# Patient Record
Sex: Female | Born: 1960 | Race: Black or African American | Hispanic: No | Marital: Single
Health system: Southern US, Community
[De-identification: ages and names within clinical notes are randomized; demographics above are authoritative.]

## PROBLEM LIST (undated history)

## (undated) DIAGNOSIS — G8929 Other chronic pain: Secondary | ICD-10-CM

## (undated) DIAGNOSIS — I10 Essential (primary) hypertension: Secondary | ICD-10-CM

## (undated) DIAGNOSIS — J45909 Unspecified asthma, uncomplicated: Secondary | ICD-10-CM

## (undated) DIAGNOSIS — Z9109 Other allergy status, other than to drugs and biological substances: Secondary | ICD-10-CM

## (undated) DIAGNOSIS — M5430 Sciatica, unspecified side: Secondary | ICD-10-CM

## (undated) DIAGNOSIS — M549 Dorsalgia, unspecified: Secondary | ICD-10-CM

## (undated) DIAGNOSIS — M47812 Spondylosis without myelopathy or radiculopathy, cervical region: Secondary | ICD-10-CM

## (undated) DIAGNOSIS — J449 Chronic obstructive pulmonary disease, unspecified: Secondary | ICD-10-CM

## (undated) DIAGNOSIS — F419 Anxiety disorder, unspecified: Secondary | ICD-10-CM

## (undated) HISTORY — PX: CERVICAL DISC SURGERY: SHX588

## (undated) HISTORY — DX: Dorsalgia, unspecified: M54.9

## (undated) HISTORY — PX: COLON RESECTION: SHX5231

## (undated) HISTORY — PX: OTHER SURGICAL HISTORY: SHX169

## (undated) HISTORY — DX: Sciatica, unspecified side: M54.30

## (undated) HISTORY — DX: Essential (primary) hypertension: I10

## (undated) HISTORY — DX: Other chronic pain: G89.29

## (undated) HISTORY — PX: LAPAROSCOPY: SHX197

## (undated) HISTORY — DX: Spondylosis without myelopathy or radiculopathy, cervical region: M47.812

## (undated) HISTORY — PX: TOTAL ABDOMINAL HYSTERECTOMY: SHX209

## (undated) HISTORY — PX: KNEE ARTHROSCOPY: SHX127

## (undated) HISTORY — DX: Anxiety disorder, unspecified: F41.9

---

## 2009-09-01 ENCOUNTER — Ambulatory Visit: Payer: Self-pay | Admitting: Family Medicine

## 2010-01-07 ENCOUNTER — Ambulatory Visit: Payer: Self-pay

## 2010-02-24 ENCOUNTER — Emergency Department: Payer: Self-pay | Admitting: Emergency Medicine

## 2010-06-15 ENCOUNTER — Emergency Department: Payer: Self-pay | Admitting: Emergency Medicine

## 2011-01-15 IMAGING — CT CT CERVICAL SPINE WITHOUT CONTRAST
3 series · 16 of 33 positions shown, 19 images · non-contrast
Comparison: None

REASON FOR EXAM: pain after injury
COMMENTS:

PROCEDURE:     CT  - CT CERVICAL SPINE WO  - June 16, 2010  [DATE]
RESULT:     Clinical Indication: Trauma
TECHNIQUE: Multiple axial CT images from the skull base to the mid vertebral
body of T1. obtained with sagittal and coronal reformatted images provided.

[Series 3: axial · axial · 0.33mm/px · z∈[+797,+926]mm · 8 of 81 slices shown, 10 images]
[im 7/81  soft-tissue]
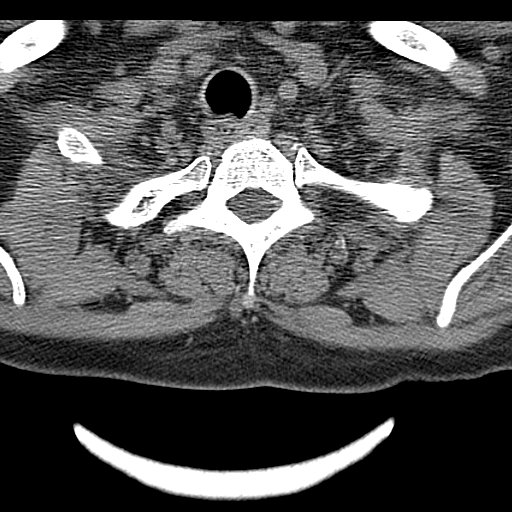
[im 7/81  bone]
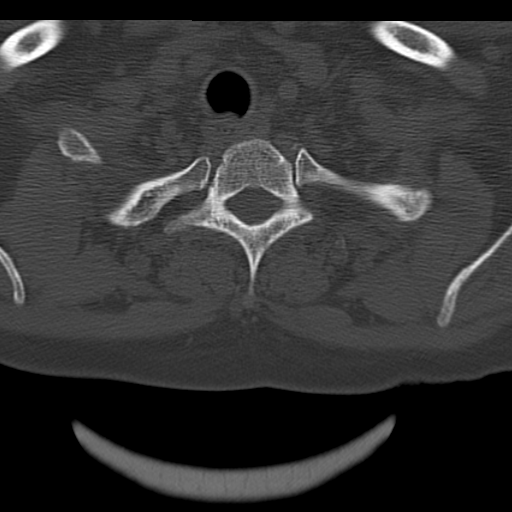
[im 19/81  bone]
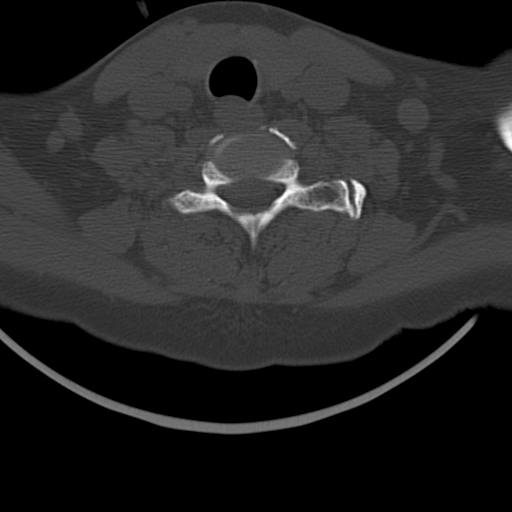
[im 25/81  bone]
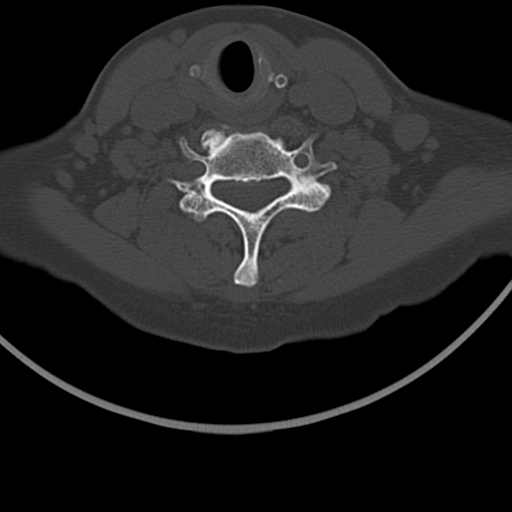
[im 37/81  bone]
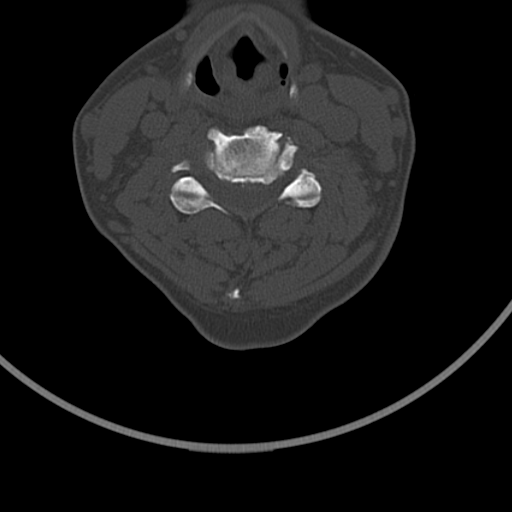
[im 44/81  soft-tissue]
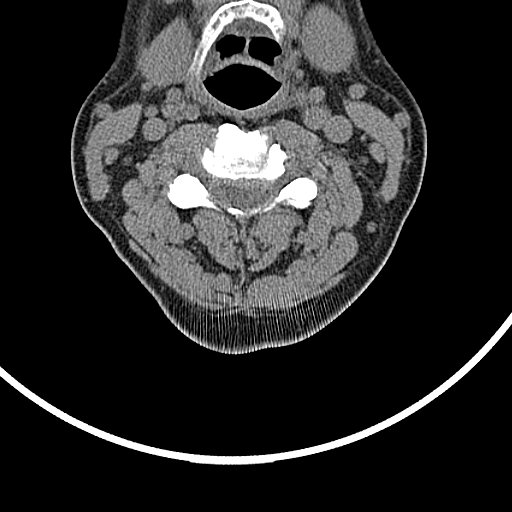
[im 44/81  bone]
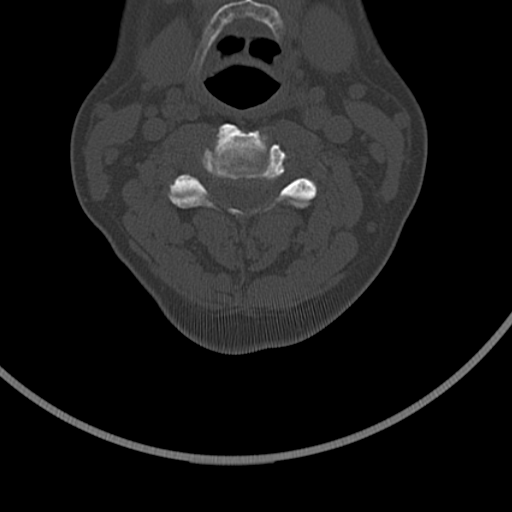
[im 56/81  bone]
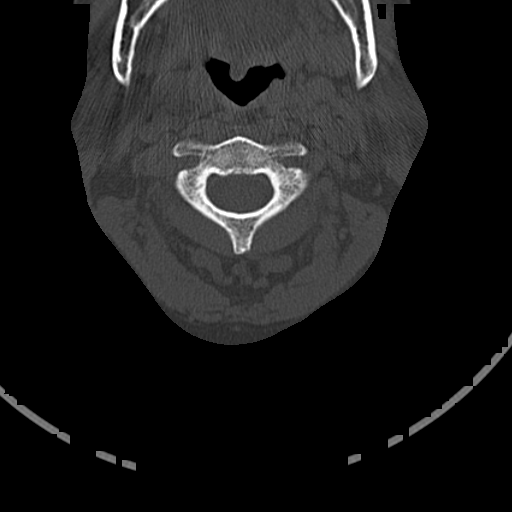
[im 62/81  bone]
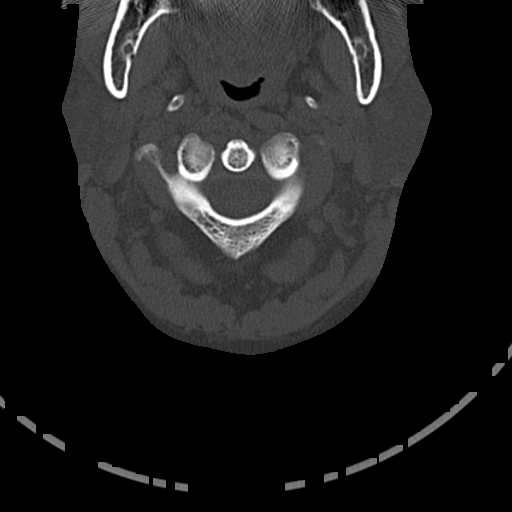
[im 74/81  bone]
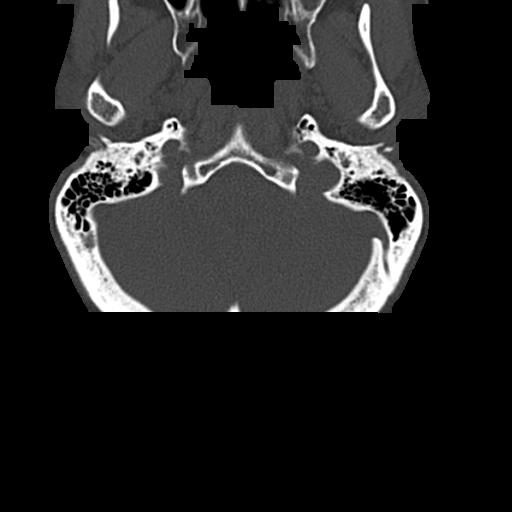

[Series 4: sagittal · sagittal · 0.37mm/px · 5 of 42 slices shown, 6 images]
[im 14/42  bone]
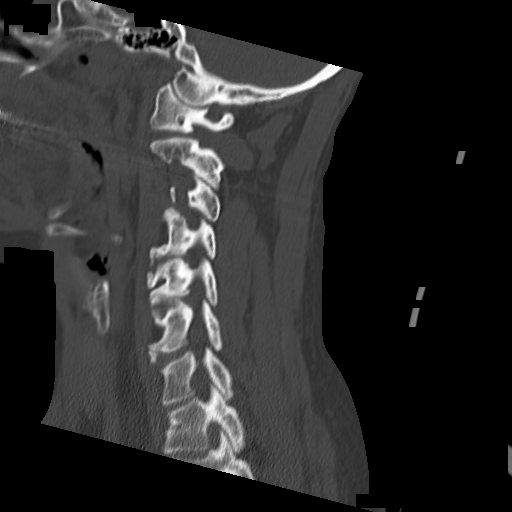
[im 18/42  bone]
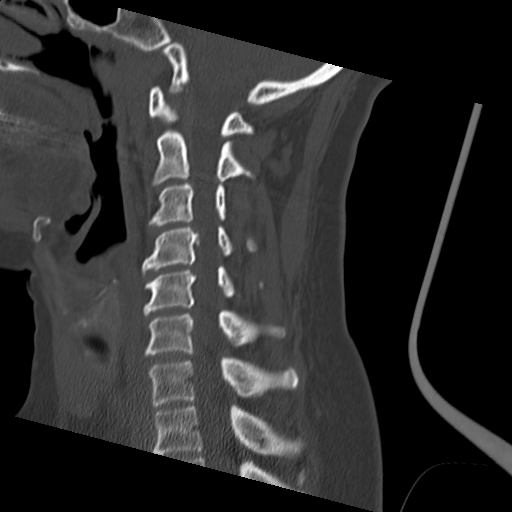
[im 21/42  soft-tissue]
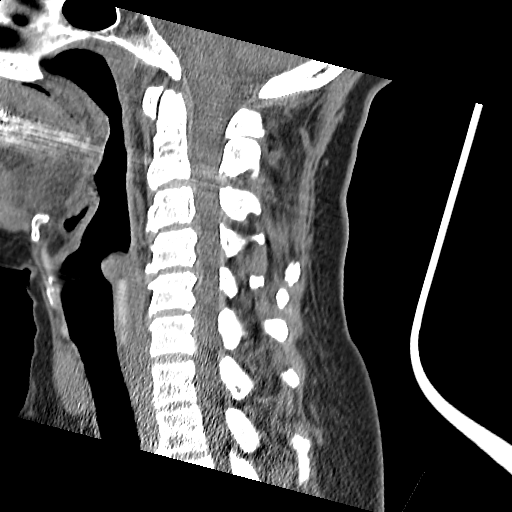
[im 21/42  bone]
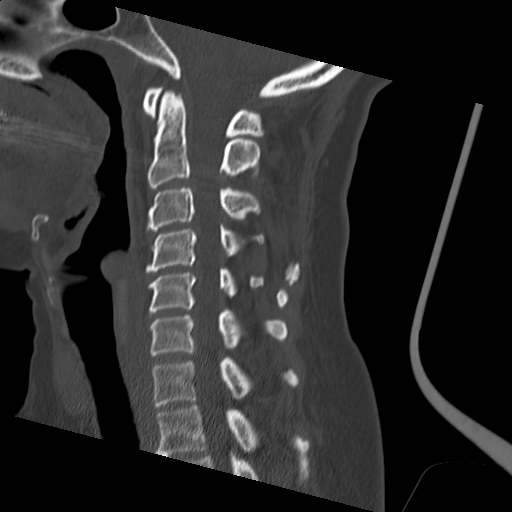
[im 24/42  bone]
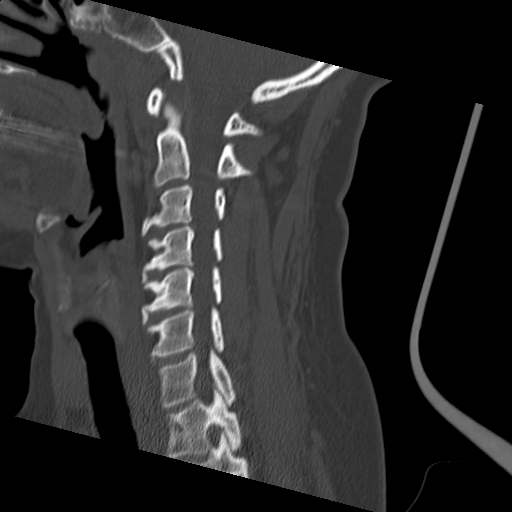
[im 28/42  bone]
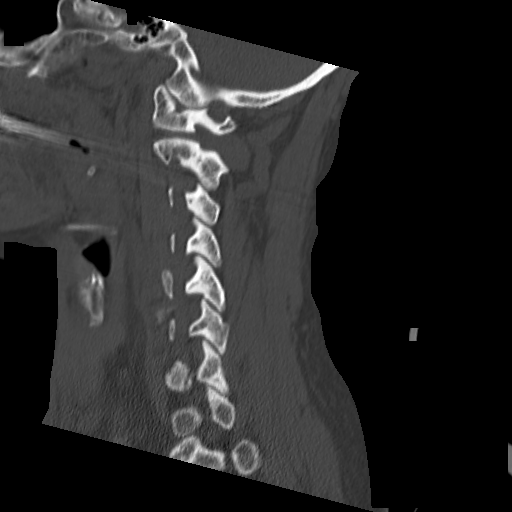

[Series 5: coronal · coronal · 0.37mm/px · 3 of 34 slices shown]
[im 7/34  bone]
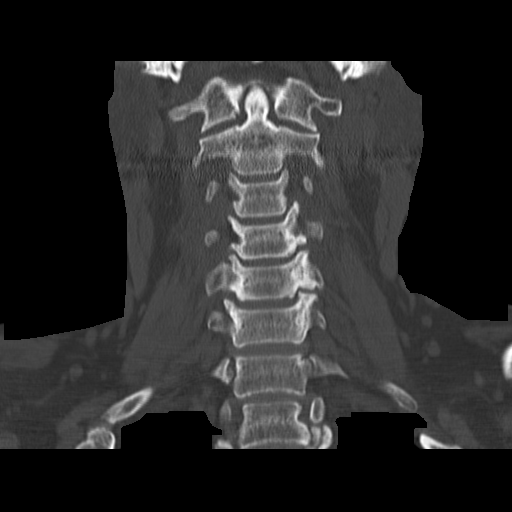
[im 14/34  bone]
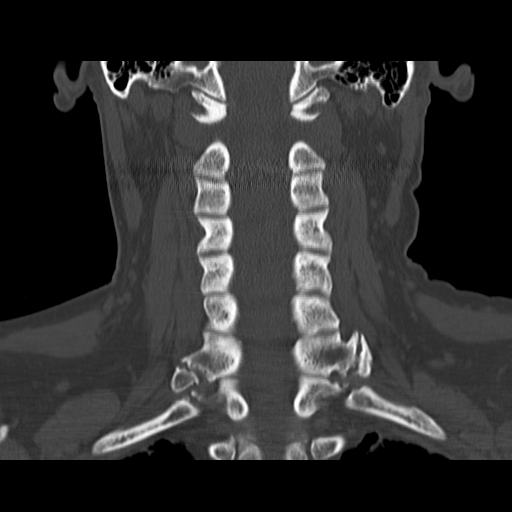
[im 20/34  bone]
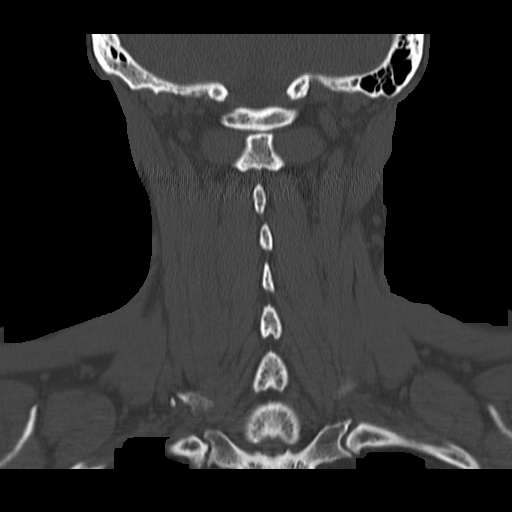

[16 of 33 positions shown; findings below may reference images not displayed]

FINDINGS: The alignment is anatomic. The vertebral body heights are maintained. There
is loss the normal cervical lordosis with straightening. There is no acute
fracture or static listhesis. The prevertebral soft tissues are normal. The
intraspinal soft tissues are not fully imaged on this examination due to
poor soft tissue contrast, but there is no soft tissue gross abnormality.

There is mild degenerative disc disease throughout the cervical spine most
significant at C3-C4, C4-C5 and C5-C6. There are mild broad-based disc
bulges at C3-C4 and C4-C5.

There are bullous changes at the lung apices.
IMPRESSION: 1. No acute osseous injury of the cervical spine.

2. Ligamentous injury is not evaluated. If there is high clinical concern
for ligamentous injury, consider MRI or flexion/extension radiographs as
clinically indicated and tolerated.

## 2011-02-07 ENCOUNTER — Encounter (HOSPITAL_COMMUNITY)
Admission: RE | Admit: 2011-02-07 | Discharge: 2011-02-07 | Disposition: A | Discharge status: Home / Self Care | Payer: Worker's Compensation | Source: Ambulatory Visit | Attending: Neurosurgery | Admitting: Neurosurgery

## 2011-02-07 ENCOUNTER — Ambulatory Visit (HOSPITAL_COMMUNITY)
Admission: RE | Admit: 2011-02-07 | Discharge: 2011-02-07 | Disposition: A | Discharge status: Home / Self Care | Payer: Worker's Compensation | Source: Ambulatory Visit | Attending: Neurosurgery | Admitting: Neurosurgery

## 2011-02-07 ENCOUNTER — Other Ambulatory Visit (HOSPITAL_COMMUNITY): Payer: Self-pay | Admitting: Neurosurgery

## 2011-02-07 DIAGNOSIS — M47812 Spondylosis without myelopathy or radiculopathy, cervical region: Secondary | ICD-10-CM | POA: Insufficient documentation

## 2011-02-07 DIAGNOSIS — Z87891 Personal history of nicotine dependence: Secondary | ICD-10-CM | POA: Insufficient documentation

## 2011-02-07 DIAGNOSIS — M5020 Other cervical disc displacement, unspecified cervical region: Secondary | ICD-10-CM

## 2011-02-07 DIAGNOSIS — Z01818 Encounter for other preprocedural examination: Secondary | ICD-10-CM | POA: Insufficient documentation

## 2011-02-07 DIAGNOSIS — J449 Chronic obstructive pulmonary disease, unspecified: Secondary | ICD-10-CM | POA: Insufficient documentation

## 2011-02-07 DIAGNOSIS — J4489 Other specified chronic obstructive pulmonary disease: Secondary | ICD-10-CM | POA: Insufficient documentation

## 2011-02-07 DIAGNOSIS — R7611 Nonspecific reaction to tuberculin skin test without active tuberculosis: Secondary | ICD-10-CM | POA: Insufficient documentation

## 2011-02-07 DIAGNOSIS — R05 Cough: Secondary | ICD-10-CM | POA: Insufficient documentation

## 2011-02-07 DIAGNOSIS — R059 Cough, unspecified: Secondary | ICD-10-CM | POA: Insufficient documentation

## 2011-02-07 LAB — CBC
HCT: 40.9 % (ref 36.0–46.0)
Hemoglobin: 13.9 g/dL (ref 12.0–15.0)
RBC: 4.85 MIL/uL (ref 3.87–5.11)
WBC: 12.7 10*3/uL — ABNORMAL HIGH (ref 4.0–10.5)

## 2011-02-07 LAB — BASIC METABOLIC PANEL
CO2: 26 mEq/L (ref 19–32)
Chloride: 110 mEq/L (ref 96–112)
GFR calc Af Amer: >60 mL/min (ref >60)
Glucose, Bld: 97 mg/dL (ref 70–99)
Potassium: 4.9 mEq/L (ref 3.5–5.1)
Sodium: 144 mEq/L (ref 135–145)

## 2011-02-07 LAB — SURGICAL PCR SCREEN: MRSA, PCR: NEGATIVE

## 2011-02-10 ENCOUNTER — Inpatient Hospital Stay (HOSPITAL_COMMUNITY)
Admission: RE | Admit: 2011-02-10 | Discharge: 2011-02-13 | DRG: 473 | Disposition: A | Discharge status: Home / Self Care | Payer: Worker's Compensation | Source: Ambulatory Visit | Attending: Neurosurgery | Admitting: Neurosurgery

## 2011-02-10 ENCOUNTER — Inpatient Hospital Stay (HOSPITAL_COMMUNITY): Payer: Worker's Compensation

## 2011-02-10 DIAGNOSIS — M4802 Spinal stenosis, cervical region: Secondary | ICD-10-CM | POA: Diagnosis present

## 2011-02-10 DIAGNOSIS — I10 Essential (primary) hypertension: Secondary | ICD-10-CM | POA: Diagnosis present

## 2011-02-10 DIAGNOSIS — M4712 Other spondylosis with myelopathy, cervical region: Principal | ICD-10-CM | POA: Diagnosis present

## 2011-02-11 NOTE — Op Note (Signed)
NAMELADELL, BEY                  ACCOUNT NO.:  [id number]  MEDICAL RECORD NO.:  [id number]           PATIENT TYPE:  I  LOCATION:  3037                         FACILITY:  [facility]  PHYSICIAN:  Danae Orleans. Venetia Maxon, M.D.  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  PROCEDURE:  Anterior cervical decompression and fusion C3-4, C4-5 and C5- 6 levels with PEEK interbody cages, morselized bone autograft, allograft anterior cervical plate.  SURGEON:  Danae Orleans. Venetia Maxon, MD  ASSISTANT:  Georgiann Cocker, RN and Hilda Lias, MD  ANESTHESIA:  General endotracheal anesthesia.  ESTIMATED BLOOD LOSS:  Minimal.  COMPLICATIONS:  None.  DISPOSITION:  Recovery.  INDICATIONS:  Victoria Mata is a 50 year old woman with a cervical spondylitic myelopathy with cord compression.  She has marked spinal stenosis at C3-4 and C4-5 levels and to a less degree at C5-6 level.  It was elected to take her to surgery for anterior cervical decompression and fusion at C3-4, C4-5 and C5-6 levels.  PROCEDURE:  Ms. Schnelle was brought to the operating room.  Following satisfactory and uncomplicated induction of general endotracheal anesthesia which was done with maintaining her neck in neutral alignment, she was placed in 5 pounds halter traction.  Her anterior neck was then prepped and draped in the usual sterile fashion.  The area of planned incision was infiltrated with local lidocaine.  Incision was made from the midline to the anterior border of the sternocleidomastoid muscle on the left side of midline, carried through platysma layer. Subplatysmal  dissection was performed exposing the anterior border of the sternocleidomastoid muscle, keeping the carotid sheath lateral and trachea and esophagus kept medial exposed.  The anterior cervical spine was exposed and a bent spinal needle was placed over felt to be the C3-4 and C4-5 level.  This was confirmed on intraoperative x-ray.  She had significant spondylitic degenerative changes at C3-4, C4-5 and C5-6 levels and ventral osteophytes were removed with Leksell rongeurs and osteophyte tool.  The longus colli muscles were taken down from the anterior cervical spine with electrocautery and Key elevator, C3 through C6 levels.  Initially, decompression was performed at C3-4 and C4-5 levels, but these were the levels with most severe spinal cord compression. Interspace were incised and osteophytes were removed and disk material was removed.  The distraction pins were placed at C3-C4 and using the interspace opened, endplates were eburnated with high- speed drill and uncinate spurs were drilled down.  The spinal cord dura was decompressed and large herniated disk was removed with significant decompression of the spinal cord dura.  Hemostasis assured with Gelfoam soaked in thrombin and then attention was turned to the C4-5 level where similar decompression was performed and a large herniated disk was also removed with significant decompression of the spinal cord and dura. After trial  sizing 5-mm medium PEEK interbody cages were selected, they were packed with PureGen a soaked profuse blocks which were removed along with bone autograft.  Attention was then turned to the C5-6 level, where similar decompression was performed.  Spinal cord dura and C6 nerve roots were widely decompressed.  After trial sizing, a 6-mm cage was packed in a similar fashion, inserted in the interspace and countersunk appropriately.  Traction weight was removed.  The distraction pin hole sites were packed with Gelfoam.   A 48-mm Trestle anterior cervical plate was then affixed to the anterior cervical spine using variable angle 12-mm screws, 2 at C3, 2 at C4, 2 at C5, 2 at C6. All screws had excellent purchase.  Locking mechanisms were engaged. Final x-ray demonstrated well-positioned interbody graft in the anterior cervical plate.  The wound was irrigated.  Soft tissues were inspected and found to be in good repair.  Hemostasis was assured.  A #10 JP drain was inserted through separate stab incision and anchored with a Vicryl stitch.  The platysma layer was closed with 3-0 Vicryl sutures.  Skin edges were reapproximated with 3-0 Vicryl subcuticular stitch.  The wound was dressed with Dermabond.  The patient was extubated in the operating room, taken to recovery room in stable satisfactory condition having tolerated the operation well.  Counts were correct at the end of the case.     Danae Orleans. Venetia Maxon, M.D.     JDS/MEDQ  D:  02/10/2011  T:  02/11/2011  Job:  [id number]  Electronically Signed by Maeola Harman M.D. on 02/11/2011 10:01:55 AM

## 2011-02-20 ENCOUNTER — Emergency Department (HOSPITAL_COMMUNITY): Payer: Worker's Compensation

## 2011-02-20 ENCOUNTER — Inpatient Hospital Stay (HOSPITAL_COMMUNITY)
Admission: EM | Admit: 2011-02-20 | Discharge: 2011-02-26 | DRG: 093 | Disposition: A | Discharge status: Home Health | Payer: Worker's Compensation | Attending: Neurosurgery | Admitting: Neurosurgery

## 2011-02-20 DIAGNOSIS — Y831 Surgical operation with implant of artificial internal device as the cause of abnormal reaction of the patient, or of later complication, without mention of misadventure at the time of the procedure: Secondary | ICD-10-CM | POA: Diagnosis present

## 2011-02-20 DIAGNOSIS — T85738A Infection and inflammatory reaction due to other nervous system device, implant or graft, initial encounter: Principal | ICD-10-CM | POA: Diagnosis present

## 2011-02-20 LAB — DIFFERENTIAL
Basophils Relative: 0 % (ref 0–1)
Eosinophils Absolute: 0.2 10*3/uL (ref 0.0–0.7)
Monocytes Absolute: 1.5 10*3/uL — ABNORMAL HIGH (ref 0.1–1.0)
Neutrophils Relative %: 63 % (ref 43–77)

## 2011-02-20 LAB — URINALYSIS, ROUTINE W REFLEX MICROSCOPIC
Nitrite: NEGATIVE
Specific Gravity, Urine: 1.034 — ABNORMAL HIGH (ref 1.005–1.030)
pH: 6.5 (ref 5.0–8.0)

## 2011-02-20 LAB — CBC
MCH: 28.4 pg (ref 26.0–34.0)
MCHC: 34 g/dL (ref 30.0–36.0)
Platelets: 584 10*3/uL — ABNORMAL HIGH (ref 150–400)
RBC: 4.29 MIL/uL (ref 3.87–5.11)

## 2011-02-20 LAB — COMPREHENSIVE METABOLIC PANEL
CO2: 26 mEq/L (ref 19–32)
Calcium: 10.3 mg/dL (ref 8.4–10.5)
Creatinine, Ser: 0.79 mg/dL (ref 0.4–1.2)
GFR calc Af Amer: >60 mL/min (ref >60)
GFR calc non Af Amer: >60 mL/min (ref >60)
Glucose, Bld: 90 mg/dL (ref 70–99)

## 2011-02-20 LAB — PROTIME-INR: Prothrombin Time: 14 seconds (ref 11.6–15.2)

## 2011-02-21 LAB — URINE CULTURE

## 2011-02-23 DIAGNOSIS — T8140XA Infection following a procedure, unspecified, initial encounter: Secondary | ICD-10-CM

## 2011-02-23 LAB — CBC
MCV: 84.4 fL (ref 78.0–100.0)
Platelets: 561 10*3/uL — ABNORMAL HIGH (ref 150–400)
RBC: 4.03 MIL/uL (ref 3.87–5.11)
WBC: 20.2 10*3/uL — ABNORMAL HIGH (ref 4.0–10.5)

## 2011-02-23 LAB — DIFFERENTIAL
Basophils Absolute: 0 10*3/uL (ref 0.0–0.1)
Basophils Relative: 0 % (ref 0–1)
Lymphocytes Relative: 33 % (ref 12–46)
Monocytes Relative: 7 % (ref 3–12)
Neutro Abs: 11.9 10*3/uL — ABNORMAL HIGH (ref 1.7–7.7)

## 2011-02-24 LAB — VANCOMYCIN, TROUGH: Vancomycin Tr: 12.1 ug/mL (ref 10.0–20.0)

## 2011-02-26 ENCOUNTER — Inpatient Hospital Stay (HOSPITAL_COMMUNITY): Payer: Worker's Compensation

## 2011-03-01 LAB — CULTURE, BLOOD (SINGLE)
Culture  Setup Time: 201202292124
Culture: NO GROWTH

## 2011-03-08 NOTE — Discharge Summary (Signed)
  Victoria Mata, Victoria Mata                  ACCOUNT NO.:  [id number]  MEDICAL RECORD NO.:  [id number]           PATIENT TYPE:  I  LOCATION:  3037                         FACILITY:  [facility]  PHYSICIAN:  Stefani Dama, M.D.  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  OPERATIONS AND PROCEDURES:  Anterior cervical decompression with fusion C3-4, C4-5, C5-6.  BRIEF HISTORY AND HOSPITAL COURSE:  The patient is a 50 year old female with cervical spondylitic myelopathy and cord compression with marked spinal stenosis with symptomatology.  She was advised on surgical decompression due to her cord compression and elects to proceed.  She tolerated this procedure well on February 10, 2011.  Postoperatively, she was placed on Percocet and IV morphine for pain control.  Due to her three-level fusion, she was placed on some Decadron to control prevertebral swelling.  She made good progress during her hospitalization and was ready for discharge home on February 13, 2011. She was ambulating safely, no swallowing difficulties.  No respiratory distress.  Neurovascularly intact bilateral upper and lower extremities. She did have some borderline hypertensive episodes during her hospitalization.  She has never been on any blood pressure medications and it has been recommended to the patient that she follow up with her primary care physician as an outpatient in case she has some undiagnosed hypertension.  She has agreed to contact her primary care physician this week for an appointment to follow up on that issue.  She is ready for discharge home  February 13, 2011, afebrile.  Neurovascularly intact, eating well, voiding well.  DISCHARGE CONDITION:  Stable and improved.  DISCHARGE INSTRUCTIONS:  Discharged to home.  Given prescription for Percocet 5/325 one p.o. q.4 h. p.r.n. pain.  We are going to place her on Sterapred Dosepak 6-day course 10 mg to take as directed.  She has some muscle relaxers at home.  She is going to follow up with her primary care physician regarding hypertension.  She will follow up with Dr. Venetia Maxon in office as an outpatient in 3-4 weeks.  Call our office for an appointment [phone].  She will contact our office prior to follow up if she has any question or concerns.  She should continue to follow her neck precautions and she is also going to be on a bowel regimen at home of Colace, Dulcolax tablets, and milk of magnesia.  All questions encouraged, answered and addressed.     Aura Fey Bobbe Medico.   ______________________________ Stefani Dama, M.D.    SCI/MEDQ  D:  02/13/2011  T:  02/14/2011  Job:  [id number]  Electronically Signed by Orlin Hilding P.A. on 02/24/2011 01:36:23 PM Electronically Signed by Barnett Abu M.D. on 03/08/2011 09:27:32 AM

## 2011-03-09 ENCOUNTER — Ambulatory Visit (HOSPITAL_COMMUNITY)
Admission: RE | Admit: 2011-03-09 | Discharge: 2011-03-09 | Disposition: A | Discharge status: Home / Self Care | Payer: Self-pay | Source: Ambulatory Visit | Attending: Neurosurgery | Admitting: Neurosurgery

## 2011-03-17 NOTE — H&P (Signed)
Victoria Mata                  ACCOUNT NO.:  [id number]  MEDICAL RECORD NO.:  [id number]           PATIENT TYPE:  I  LOCATION:  3035                         FACILITY:  [facility]  PHYSICIAN:  Coletta Memos, M.D.     DATE OF BIRTH:  03/30/1961  DATE OF ADMISSION:  02/20/2011 DATE OF DISCHARGE:                             HISTORY & PHYSICAL   ADMITTING DIAGNOSES: 1. Cervical anterior cervical diskectomy and fusion wound drainage. 2. Dysphagia.  INDICATIONS:  Ms. Victoria Mata is a 50 year old woman who was admitted on February 10, 2011, by Dr. Venetia Maxon for an elective anterior cervical decompression and arthrodesis at C3-C4, C4-C5, and C5-C6 secondary to severe spinal cord compression.  She was admitted and taken to the operating room and postoperatively states that she was doing very well. She had a drain placed postoperatively, which was removed by the time of discharge.  She was also on some Decadron initially.  She had good strength in the upper and lower extremities at discharge.  She is not having any significant drainage from the drain site.  She went home and was doing well until last week when she did call our office and spoke with Dr. Venetia Maxon.  She felt that the wound was becoming redder and that there was some swelling to it.  She also had some very small amounts of drainage.  Dr. Venetia Maxon called in an antibiotic which she started to take that being azithromycin, and he also gave her prednisone taper.  She was doing okay, not well, not bad when Friday she noted she had a significant amount of drainage from the drain hole .  She says it was like it was gushing.  She called her physician and decided to hold on until Monday but due to her boyfriend's insistence, she came to the hospital today for further evaluation.  She was seen and evaluated in the emergency room by Dr. Bruce Donath for her neck pain.  She has had a CT scan which showed that all hardware was in good position, but she  did have some prevertebral swelling.  I was contacted for that.  She has been running a mild fever also at approximately 99, and she was measured in the emergency room at 99.3 and had a white count of 20,000.  She had a white count of 22.  Urine was negative.  Rest of her labs were unremarkable.  She will be admitted for IV antibiotics and also for IV steroid to help with her swallowing.  I would expect this to be a brief hospitalization.  On exam, she is alert and oriented x4 and answering all questions appropriately.  Memory length, attention, span, and fund of knowledge are normal.  Pupils equal, round, and reactive to light.  Funduscopic exam shows sharp optic disks.  Normal muscle tone, bulk, and coordination.  Uvula is in the midline.  Shoulder shrug is normal. Tongue protrudes in midline.  Symmetric facial sensation and movements. Hearing intact to voice bilaterally.  Pulses good at the wrist bilaterally.  Findings on CT reviewed.  She will be admitted to the neurosurgical service.  I will also give her IV Kefzol and IV Decadron.  Right now, the wound is not particularly swollen on manual exam.  She has some bruising around it.  There is no foul odor.  The exudate is not purulent from the site.  I did place Steri-Strips to reapproximate the skin edges at the drain site.  I have explained this to Ms. Venuto.  She is in agreement.          ______________________________ Coletta Memos, M.D.     KC/MEDQ  D:  02/20/2011  T:  02/21/2011  Job:  [id number]  Electronically Signed by Coletta Memos M.D. on 03/17/2011 02:36:36 PM

## 2011-03-17 NOTE — H&P (Signed)
  Victoria Mata, Victoria Mata                  ACCOUNT NO.:  [id number]  MEDICAL RECORD NO.:  [id number]           PATIENT TYPE:  I  LOCATION:  3035                         FACILITY:  [facility]  PHYSICIAN:  Coletta Memos, M.D.     DATE OF BIRTH:  [birth year]  DATE OF ADMISSION:  02/20/2011 DATE OF DISCHARGE:                             HISTORY & PHYSICAL   ADDENDUM  PAST MEDICAL HISTORY:  Significant for asthma.  She does smoke, does not use illicit drugs.  She does drink occasionally.  She has no known drug allergies.  Mother is deceased, had squamous cell carcinoma of the esophagus. Father is alive, he has congestive heart failure, he is currently an inpatient actually at [facility].  PAST SURGICAL HISTORY:  Includes a total abdominal hysterectomy.  She has had an exploratory laparotomy in the early 90s.  REVIEW OF SYSTEMS:  Positive for the neck pain.  ADDENDUM TO PHYSICAL EXAMINATION:  CHEST:  Lung fields clear. HEART:  Regular rhythm and rate.  No murmurs or rubs.  Pulses are good at the wrists and feet bilaterally. HEENT:  Tongue and uvula in midline. NECK:  There are no cervical masses or bruits.  She does have the opening in the drain site which again I Steri-Stripped and she does have some sort of reaction to adhesive tape outside a paper tape which may leave a rash.          ______________________________ Coletta Memos, M.D.     KC/MEDQ  D:  02/20/2011  T:  02/21/2011  Job:  [id number]  Electronically Signed by Coletta Memos M.D. on 03/17/2011 02:36:41 PM

## 2011-04-06 ENCOUNTER — Ambulatory Visit (INDEPENDENT_AMBULATORY_CARE_PROVIDER_SITE_OTHER): Payer: Worker's Compensation | Admitting: Internal Medicine

## 2011-04-06 ENCOUNTER — Ambulatory Visit: Payer: Worker's Compensation | Admitting: Internal Medicine

## 2011-04-06 ENCOUNTER — Encounter: Payer: Self-pay | Admitting: Internal Medicine

## 2011-04-06 VITALS — BP 143/96 | HR 89 | Temp 98.3°F | Ht 65.0 in | Wt 141.8 lb

## 2011-04-06 DIAGNOSIS — T8149XA Infection following a procedure, other surgical site, initial encounter: Secondary | ICD-10-CM

## 2011-04-06 DIAGNOSIS — T8140XA Infection following a procedure, unspecified, initial encounter: Secondary | ICD-10-CM

## 2011-04-06 NOTE — Patient Instructions (Signed)
Stop your antibiotics today.

## 2011-04-06 NOTE — Progress Notes (Signed)
  Subjective:    Patient ID: Kanoe Wanner, female    DOB: 12-Oct-1961, 50 y.o.   MRN: [id number]  HPI Mrs. Lowanda Cashaw is a 50 year old who was hospitalized in late February with neck pain, swelling, dysphagia and erythema around her surgical incision from recent fusion of the C3-6 vertebrae. A CT scan revealed prevertebral fluid over the site of her surgery and her white count was elevated above 20,000. She had no fever but it was assumed that she probably had developed a postoperative seroma that became infected. There was no specimen for Gram stain or culture. She was treated with empiric antibiotics and improved promptly. She was discharged on IV vancomycin and oral Cipro and is now completing her sixth week of therapy. She is feeling much better and has only minimal incisional pain. Her dysphagia has resolved.   Review of Systems     Objective:   Physical Exam  Constitutional: She appears well-developed and well-nourished. No distress.  Neck:       Her neck incision is healing nicely. The erythema and pain have resolved. There is no fluctuance or drainage.          Assessment & Plan:

## 2011-04-06 NOTE — Assessment & Plan Note (Signed)
I suspect that her infection is fully cured at this point and will stop all antibiotic therapy and have her PICC removed.

## 2011-04-06 NOTE — Progress Notes (Signed)
Order to remove PICC line obtained from Dr. Orvan Falconer.  Pt. identified with name and date of birth.  PICC dressing removed, site unremarkable.  PICC line removed using sterile procedure @ 1540 pm.   PICC length equal to that noted in pt's hospital chart of 35 cm.  Sterile petroleum gauze + sterile 4X4 applied to PICC site, pressure applied for 10 minutes and covered with Medipore tape as a pressure dressing.  Pt. instructed to limit use of arm for 1 hour.  Pt. instructed that the pressure dressing should remain in place for 24 hours.  Pt. verablized understanding of these instructions.  Jennet Maduro, RN

## 2011-04-18 ENCOUNTER — Ambulatory Visit: Payer: Self-pay | Admitting: Internal Medicine

## 2011-05-10 ENCOUNTER — Ambulatory Visit (INDEPENDENT_AMBULATORY_CARE_PROVIDER_SITE_OTHER): Payer: Worker's Compensation | Admitting: Internal Medicine

## 2011-05-10 VITALS — BP 130/87 | HR 78 | Temp 98.7°F | Ht 65.0 in | Wt 143.0 lb

## 2011-05-10 DIAGNOSIS — T8140XA Infection following a procedure, unspecified, initial encounter: Secondary | ICD-10-CM

## 2011-05-10 DIAGNOSIS — T8149XA Infection following a procedure, other surgical site, initial encounter: Secondary | ICD-10-CM

## 2011-05-10 NOTE — Progress Notes (Signed)
  Subjective:    Patient ID: Victoria Mata, female    DOB: [birth year], 50 y.o.   MRN: [id number]  HPI Victoria Mata is in for a routine followup visit. She completed a six-week course of her empiric antibiotic therapy one month ago for presumed neck infection after a recent cervical spine surgery. She is feeling better. She is not having any neck pain or further swallowing difficulties.    Review of Systems     Objective:   Physical Exam  Constitutional: She appears well-nourished. No distress.  Neck:       Her anterior neck incisions are well healed without any evidence of inflammation.          Assessment & Plan:

## 2011-05-10 NOTE — Assessment & Plan Note (Signed)
I suspect that her neck infection has completely resolved. I see no need for further diagnostic testing or antibiotic therapy at this time.

## 2011-06-16 ENCOUNTER — Ambulatory Visit: Payer: Self-pay | Admitting: Family Medicine

## 2011-06-27 ENCOUNTER — Ambulatory Visit: Payer: Self-pay | Admitting: Family Medicine

## 2011-08-26 ENCOUNTER — Inpatient Hospital Stay: Payer: Self-pay | Admitting: Internal Medicine

## 2011-08-26 DIAGNOSIS — R079 Chest pain, unspecified: Secondary | ICD-10-CM

## 2011-09-15 ENCOUNTER — Encounter: Payer: Self-pay | Admitting: Cardiology

## 2011-10-06 ENCOUNTER — Encounter: Payer: Self-pay | Admitting: Cardiology

## 2011-10-06 ENCOUNTER — Encounter: Payer: Self-pay | Admitting: *Deleted

## 2011-10-07 ENCOUNTER — Encounter: Payer: Self-pay | Admitting: Cardiology

## 2011-10-13 ENCOUNTER — Encounter: Payer: Self-pay | Admitting: Cardiovascular Disease

## 2012-02-24 ENCOUNTER — Emergency Department: Payer: Self-pay | Admitting: Emergency Medicine

## 2012-04-21 ENCOUNTER — Ambulatory Visit: Payer: Self-pay | Admitting: Neurosurgery

## 2012-04-23 ENCOUNTER — Ambulatory Visit: Payer: Self-pay | Admitting: Neurosurgery

## 2012-04-23 LAB — CREATININE, SERUM: EGFR (Non-African Amer.): >60

## 2012-09-18 ENCOUNTER — Ambulatory Visit: Payer: Self-pay | Admitting: Family Medicine

## 2012-09-19 ENCOUNTER — Ambulatory Visit: Payer: Self-pay | Admitting: Family Medicine

## 2013-12-12 ENCOUNTER — Ambulatory Visit: Payer: Self-pay | Admitting: Family Medicine

## 2013-12-18 ENCOUNTER — Emergency Department: Payer: Self-pay | Admitting: Emergency Medicine

## 2015-09-15 DIAGNOSIS — Z23 Encounter for immunization: Secondary | ICD-10-CM | POA: Diagnosis not present

## 2015-09-15 DIAGNOSIS — Y998 Other external cause status: Secondary | ICD-10-CM | POA: Insufficient documentation

## 2015-09-15 DIAGNOSIS — Y9389 Activity, other specified: Secondary | ICD-10-CM | POA: Insufficient documentation

## 2015-09-15 DIAGNOSIS — Z87891 Personal history of nicotine dependence: Secondary | ICD-10-CM | POA: Insufficient documentation

## 2015-09-15 DIAGNOSIS — Z79899 Other long term (current) drug therapy: Secondary | ICD-10-CM | POA: Diagnosis not present

## 2015-09-15 DIAGNOSIS — Y288XXA Contact with other sharp object, undetermined intent, initial encounter: Secondary | ICD-10-CM | POA: Insufficient documentation

## 2015-09-15 DIAGNOSIS — S41111A Laceration without foreign body of right upper arm, initial encounter: Secondary | ICD-10-CM | POA: Diagnosis not present

## 2015-09-15 DIAGNOSIS — I10 Essential (primary) hypertension: Secondary | ICD-10-CM | POA: Insufficient documentation

## 2015-09-15 DIAGNOSIS — Y9289 Other specified places as the place of occurrence of the external cause: Secondary | ICD-10-CM | POA: Diagnosis not present

## 2015-09-15 NOTE — ED Notes (Signed)
Pt in with lac to right upper arm with metal latch from screen door, bleeding controlled.

## 2015-09-16 ENCOUNTER — Emergency Department
Admission: EM | Admit: 2015-09-16 | Discharge: 2015-09-16 | Disposition: A | Discharge status: Home / Self Care | Payer: 59 | Attending: Emergency Medicine | Admitting: Emergency Medicine

## 2015-09-16 DIAGNOSIS — S41111A Laceration without foreign body of right upper arm, initial encounter: Secondary | ICD-10-CM

## 2015-09-16 MED ORDER — OXYCODONE-ACETAMINOPHEN 5-325 MG PO TABS
1.0000 | ORAL_TABLET | Freq: Once | ORAL | Status: AC
  Administered 2015-09-16: 1 via ORAL

## 2015-09-16 MED ORDER — TETANUS-DIPHTH-ACELL PERTUSSIS 5-2.5-18.5 LF-MCG/0.5 IM SUSP
0.5000 mL | Freq: Once | INTRAMUSCULAR | Status: AC
  Administered 2015-09-16: 0.5 mL via INTRAMUSCULAR

## 2015-09-16 MED ORDER — CEPHALEXIN 500 MG PO CAPS
500.0000 mg | ORAL_CAPSULE | Freq: Once | ORAL | Status: AC
  Administered 2015-09-16: 500 mg via ORAL
  Filled 2015-09-16: qty 1

## 2015-09-16 MED ORDER — TETANUS-DIPHTH-ACELL PERTUSSIS 5-2.5-18.5 LF-MCG/0.5 IM SUSP
INTRAMUSCULAR | Status: AC
  Administered 2015-09-16: 0.5 mL via INTRAMUSCULAR
  Filled 2015-09-16: qty 0.5

## 2015-09-16 MED ORDER — LIDOCAINE HCL (PF) 1 % IJ SOLN
INTRAMUSCULAR | Status: AC
  Administered 2015-09-16: 10 mL via INTRADERMAL
  Filled 2015-09-16: qty 10

## 2015-09-16 MED ORDER — OXYCODONE-ACETAMINOPHEN 5-325 MG PO TABS
ORAL_TABLET | ORAL | Status: AC
  Administered 2015-09-16: 1 via ORAL
  Filled 2015-09-16: qty 1

## 2015-09-16 MED ORDER — CEPHALEXIN 500 MG PO CAPS: 500.0000 mg | ORAL_CAPSULE | Freq: Two times a day (BID) | ORAL | Status: AC

## 2015-09-16 MED ORDER — LIDOCAINE HCL (PF) 1 % IJ SOLN
10.0000 mL | Freq: Once | INTRAMUSCULAR | Status: AC
  Administered 2015-09-16: 10 mL via INTRADERMAL

## 2015-09-16 NOTE — Discharge Instructions (Signed)

## 2015-09-16 NOTE — ED Provider Notes (Signed)
[facility] Emergency Department Provider Note  ____________________________________________  Time seen: 2:45 AM  I have reviewed the triage vital signs and the nursing notes.   HISTORY  Chief Complaint Laceration      HPI Victoria Mata is a 54 y.o. female presents with right arm laceration. She states that the metal latch on her screen door accidentally cut her arm while it was closing. Unknown last tetanus shot.     Past Medical History  Diagnosis Date  . HTN (hypertension)   . Sciatica   . Asthma   . Cervical spine arthritis     with a neck infusion  . Chronic back pain   . Anxiety     Patient Active Problem List   Diagnosis Date Noted  . Wound infection after surgery 04/06/2011    Past Surgical History  Procedure Laterality Date  . Total abdominal hysterectomy    . Colon resection    . Neck fusion      Current Outpatient Rx  Name  Route  Sig  Dispense  Refill  . carisoprodol (SOMA) 350 MG tablet   Oral   Take 350 mg by mouth 2 (two) times daily.           . cephALEXin (KEFLEX) 500 MG capsule   Oral   Take 1 capsule (500 mg total) by mouth 2 (two) times daily.   14 capsule   0   . HYDROcodone-acetaminophen (NORCO) 5-325 MG per tablet   Oral   Take 1 tablet by mouth every 8 (eight) hours as needed.           . traMADol (ULTRAM) 50 MG tablet   Oral   Take 50 mg by mouth every 6 (six) hours as needed.             Allergies Review of patient's allergies indicates no known allergies.  Family History  Problem Relation Age of Onset  . Heart attack Father   . Cancer Mother     squamous cell cancer of head and neck    Social History Social History  Substance Use Topics  . Smoking status: Former Smoker -- 0.30 packs/day for 15 years    Types: Cigarettes  . Smokeless tobacco: Never Used  . Alcohol Use: 0.5 oz/week    1 drink(s) per week    Review of Systems  Constitutional: Negative for fever. Eyes: Negative  for visual changes. ENT: Negative for sore throat. Cardiovascular: Negative for chest pain. Respiratory: Negative for shortness of breath. Gastrointestinal: Negative for abdominal pain, vomiting and diarrhea. Genitourinary: Negative for dysuria. Musculoskeletal: Negative for back pain. Skin: Positive for right arm laceration Neurological: Negative for headaches, focal weakness or numbness.   10-point ROS otherwise negative.  ____________________________________________   PHYSICAL EXAM:  VITAL SIGNS: ED Triage Vitals  Enc Vitals Group     BP 09/15/15 2307 123/81 mmHg     Pulse Rate 09/15/15 2307 86     Resp 09/15/15 2307 18     Temp 09/15/15 2307 98.2 F (36.8 C)     Temp Source 09/15/15 2307 Oral     SpO2 09/15/15 2307 96 %     Weight 09/15/15 2307 150 lb (68.04 kg)     Height 09/15/15 2307  (1.626 m)     Head Cir --      Peak Flow --      Pain Score 09/15/15 2308 9     Pain Loc --  Pain Edu? --      Excl. in GC? --     Constitutional: Alert and oriented. Well appearing and in no distress. Eyes: Conjunctivae are normal. PERRL. Normal extraocular movements. ENT   Head: Normocephalic and atraumatic.   Nose: No congestion/rhinnorhea.   Mouth/Throat: Mucous membranes are moist.   Neck: No stridor Cardiovascular: Normal rate, regular rhythm. Normal and symmetric distal pulses are present in all extremities. No murmurs, rubs, or gallops. Respiratory: Normal respiratory effort without tachypnea nor retractions. Breath sounds are clear and equal bilaterally. No wheezes/rales/rhonchi. Gastrointestinal: Soft and nontender. No distention. There is no CVA tenderness. Genitourinary: deferred Musculoskeletal: Nontender with normal range of motion in all extremities. No joint effusions.  No lower extremity tenderness nor edema. Neurologic:  Normal speech and language. No gross focal neurologic deficits are appreciated. Speech is normal.  Skin:  Six-inch  V-shaped laceration noted to the lateral portion of the right arm Psychiatric: Mood and affect are normal. Speech and behavior are normal. Patient exhibits appropriate insight and judgment.  PROCEDURES  Procedure(s) performed: LACERATION REPAIR Performed by: Darci Current Authorized by: Darci Current Consent: Verbal consent obtained. Risks and benefits: risks, benefits and alternatives were discussed Consent given by: patient Patient identity confirmed: provided demographic data Prepped and Draped in normal sterile fashion Wound explored  Laceration Location: Right arm  Laceration Length: 6 inches  No Foreign Bodies seen or palpated  Anesthesia: local infiltration  Local anesthetic: lidocaine 1%  Anesthetic total: 10 ml  Irrigation method: syringe Amount of cleaning: standard  Skin closure: 5-0 nylon   Number of sutures: 18   Technique: Simple Interrupted   Patient tolerance: Patient tolerated the procedure well with no immediate complications.     ____________________________________________   INITIAL IMPRESSION / ASSESSMENT AND PLAN / ED COURSE  Pertinent labs & imaging results that were available during my care of the patient were reviewed by me and considered in my medical decision making (see chart for details).    ____________________________________________   FINAL CLINICAL IMPRESSION(S) / ED DIAGNOSES  Final diagnoses:  Arm laceration, right, initial encounter      Darci Current, MD 09/16/15 [id number]

## 2015-09-28 ENCOUNTER — Other Ambulatory Visit: Payer: Self-pay | Admitting: Family Medicine

## 2015-09-28 DIAGNOSIS — Z1231 Encounter for screening mammogram for malignant neoplasm of breast: Secondary | ICD-10-CM

## 2015-10-09 ENCOUNTER — Ambulatory Visit
Admission: RE | Admit: 2015-10-09 | Discharge: 2015-10-09 | Disposition: A | Discharge status: Home / Self Care | Payer: 59 | Source: Ambulatory Visit | Attending: Family Medicine | Admitting: Family Medicine

## 2015-10-09 DIAGNOSIS — Z1231 Encounter for screening mammogram for malignant neoplasm of breast: Secondary | ICD-10-CM | POA: Diagnosis not present

## 2015-10-09 DIAGNOSIS — N6489 Other specified disorders of breast: Secondary | ICD-10-CM | POA: Insufficient documentation

## 2015-10-15 ENCOUNTER — Other Ambulatory Visit: Payer: Self-pay | Admitting: Family Medicine

## 2015-10-15 DIAGNOSIS — R928 Other abnormal and inconclusive findings on diagnostic imaging of breast: Secondary | ICD-10-CM

## 2015-11-05 ENCOUNTER — Ambulatory Visit
Admission: RE | Admit: 2015-11-05 | Discharge: 2015-11-05 | Disposition: A | Discharge status: Home / Self Care | Payer: 59 | Source: Ambulatory Visit | Attending: Family Medicine | Admitting: Family Medicine

## 2015-11-05 DIAGNOSIS — R928 Other abnormal and inconclusive findings on diagnostic imaging of breast: Secondary | ICD-10-CM | POA: Diagnosis not present

## 2015-11-05 DIAGNOSIS — N6489 Other specified disorders of breast: Secondary | ICD-10-CM | POA: Diagnosis present

## 2015-11-16 ENCOUNTER — Encounter
Admission: RE | Admit: 2015-11-16 | Discharge: 2015-11-16 | Disposition: A | Discharge status: Home / Self Care | Payer: 59 | Source: Ambulatory Visit | Attending: Surgery | Admitting: Surgery

## 2015-11-16 DIAGNOSIS — Z01810 Encounter for preprocedural cardiovascular examination: Secondary | ICD-10-CM | POA: Diagnosis present

## 2015-11-16 HISTORY — DX: Chronic obstructive pulmonary disease, unspecified: J44.9

## 2015-11-16 HISTORY — DX: Other allergy status, other than to drugs and biological substances: Z91.09

## 2015-11-16 LAB — SURGICAL PCR SCREEN
MRSA, PCR: NEGATIVE
Staphylococcus aureus: NEGATIVE

## 2015-11-16 NOTE — Patient Instructions (Signed)
  Your procedure is scheduled on: 11/24/15 Tues Report to Day Surgery. To find out your arrival time please call [phone] between 1PM - 3PM on 11/23/15 Mon.  Remember: Instructions that are not followed completely may result in serious medical risk, up to and including death, or upon the discretion of your surgeon and anesthesiologist your surgery may need to be rescheduled.    _x___ 1. Do not eat food or drink liquids after midnight. No gum chewing or hard candies.     ____ 2. No Alcohol for 24 hours before or after surgery.   ____ 3. Bring all medications with you on the day of surgery if instructed.    _x___ 4. Notify your doctor if there is any change in your medical condition     (cold, fever, infections).     Do not wear jewelry, make-up, hairpins, clips or nail polish.  Do not wear lotions, powders, or perfumes. You may wear deodorant.  Do not shave 48 hours prior to surgery. Men may shave face and neck.  Do not bring valuables to the hospital.    [facility] not [facility] for any belongings or valuables.               Contacts, dentures or bridgework may not be worn into surgery.  Leave your suitcase in the car. After surgery it may be brought to your room.  For patients admitted to the hospital, discharge time is determined by your                treatment team.   Patients discharged the day of surgery will not be allowed to drive home.   Please read over the following fact sheets that you were given:      _x___ Take these medicines the morning of surgery with A SIP OF WATER:    1.spiriva  2.   3.   4.  5.  6.  ____ Fleet Enema (as directed)   _x___ Use CHG Soap as directed  ____ Use inhalers on the day of surgery  ____ Stop metformin 2 days prior to surgery    ____ Take 1/2 of usual insulin dose the night before surgery and none on the morning of surgery.   ____ Stop Coumadin/Plavix/aspirin on   ____ Stop Anti-inflammatories on    ____ Stop  supplements until after surgery.    ____ Bring C-Pap to the hospital.

## 2015-11-24 ENCOUNTER — Ambulatory Visit
Admission: RE | Admit: 2015-11-24 | Discharge: 2015-11-24 | Disposition: A | Discharge status: Home / Self Care | Payer: Worker's Compensation | Source: Ambulatory Visit | Attending: Surgery | Admitting: Surgery

## 2015-11-24 ENCOUNTER — Encounter
Admission: RE | Disposition: A | Discharge status: Home / Self Care | Payer: Self-pay | Source: Ambulatory Visit | Attending: Surgery

## 2015-11-24 ENCOUNTER — Ambulatory Visit: Payer: Worker's Compensation | Admitting: Anesthesiology

## 2015-11-24 ENCOUNTER — Encounter: Payer: Self-pay | Admitting: *Deleted

## 2015-11-24 DIAGNOSIS — G8929 Other chronic pain: Secondary | ICD-10-CM | POA: Diagnosis not present

## 2015-11-24 DIAGNOSIS — M545 Low back pain: Secondary | ICD-10-CM | POA: Insufficient documentation

## 2015-11-24 DIAGNOSIS — J309 Allergic rhinitis, unspecified: Secondary | ICD-10-CM | POA: Insufficient documentation

## 2015-11-24 DIAGNOSIS — J449 Chronic obstructive pulmonary disease, unspecified: Secondary | ICD-10-CM | POA: Insufficient documentation

## 2015-11-24 DIAGNOSIS — I10 Essential (primary) hypertension: Secondary | ICD-10-CM | POA: Diagnosis not present

## 2015-11-24 DIAGNOSIS — M94261 Chondromalacia, right knee: Secondary | ICD-10-CM | POA: Insufficient documentation

## 2015-11-24 DIAGNOSIS — Z7951 Long term (current) use of inhaled steroids: Secondary | ICD-10-CM | POA: Diagnosis not present

## 2015-11-24 DIAGNOSIS — Z833 Family history of diabetes mellitus: Secondary | ICD-10-CM | POA: Diagnosis not present

## 2015-11-24 DIAGNOSIS — Z808 Family history of malignant neoplasm of other organs or systems: Secondary | ICD-10-CM | POA: Diagnosis not present

## 2015-11-24 DIAGNOSIS — Z8249 Family history of ischemic heart disease and other diseases of the circulatory system: Secondary | ICD-10-CM | POA: Diagnosis not present

## 2015-11-24 DIAGNOSIS — Z800 Family history of malignant neoplasm of digestive organs: Secondary | ICD-10-CM | POA: Diagnosis not present

## 2015-11-24 DIAGNOSIS — Z823 Family history of stroke: Secondary | ICD-10-CM | POA: Diagnosis not present

## 2015-11-24 DIAGNOSIS — M5136 Other intervertebral disc degeneration, lumbar region: Secondary | ICD-10-CM | POA: Insufficient documentation

## 2015-11-24 DIAGNOSIS — Z79899 Other long term (current) drug therapy: Secondary | ICD-10-CM | POA: Diagnosis not present

## 2015-11-24 DIAGNOSIS — Z90710 Acquired absence of both cervix and uterus: Secondary | ICD-10-CM | POA: Diagnosis not present

## 2015-11-24 DIAGNOSIS — F419 Anxiety disorder, unspecified: Secondary | ICD-10-CM | POA: Insufficient documentation

## 2015-11-24 DIAGNOSIS — Z87891 Personal history of nicotine dependence: Secondary | ICD-10-CM | POA: Diagnosis not present

## 2015-11-24 DIAGNOSIS — F329 Major depressive disorder, single episode, unspecified: Secondary | ICD-10-CM | POA: Diagnosis not present

## 2015-11-24 DIAGNOSIS — M179 Osteoarthritis of knee, unspecified: Secondary | ICD-10-CM | POA: Insufficient documentation

## 2015-11-24 HISTORY — PX: KNEE ARTHROSCOPY WITH MENISCAL REPAIR: SHX5653

## 2015-11-24 SURGERY — ARTHROSCOPY, KNEE, WITH MENISCUS REPAIR
Anesthesia: General | Laterality: Right | Wound class: Clean

## 2015-11-24 MED ORDER — PROPOFOL 10 MG/ML IV BOLUS
INTRAVENOUS | Status: DC | PRN
  Administered 2015-11-24: 160 mg via INTRAVENOUS

## 2015-11-24 MED ORDER — PHENYLEPHRINE HCL 10 MG/ML IJ SOLN
INTRAMUSCULAR | Status: DC | PRN
  Administered 2015-11-24 (×3): 100 ug via INTRAVENOUS

## 2015-11-24 MED ORDER — FAMOTIDINE 20 MG PO TABS
ORAL_TABLET | ORAL | Status: AC
  Administered 2015-11-24: 20 mg via ORAL
  Filled 2015-11-24: qty 1

## 2015-11-24 MED ORDER — LIDOCAINE HCL (CARDIAC) 20 MG/ML IV SOLN
INTRAVENOUS | Status: DC | PRN
  Administered 2015-11-24: 80 mg via INTRAVENOUS

## 2015-11-24 MED ORDER — KETOROLAC TROMETHAMINE 30 MG/ML IJ SOLN
INTRAMUSCULAR | Status: DC | PRN
  Administered 2015-11-24: 30 mg via INTRAVENOUS

## 2015-11-24 MED ORDER — LIDOCAINE HCL 1 % IJ SOLN
INTRAMUSCULAR | Status: DC | PRN
  Administered 2015-11-24: 30 mL

## 2015-11-24 MED ORDER — FAMOTIDINE 20 MG PO TABS
20.0000 mg | ORAL_TABLET | Freq: Once | ORAL | Status: AC
  Administered 2015-11-24: 20 mg via ORAL

## 2015-11-24 MED ORDER — ONDANSETRON HCL 4 MG/2ML IJ SOLN: 4.0000 mg | Freq: Four times a day (QID) | INTRAMUSCULAR | Status: DC | PRN

## 2015-11-24 MED ORDER — LACTATED RINGERS IV SOLN
INTRAVENOUS | Status: DC
  Administered 2015-11-24: 15:00:00 via INTRAVENOUS

## 2015-11-24 MED ORDER — CEFAZOLIN SODIUM-DEXTROSE 2-3 GM-% IV SOLR
2.0000 g | Freq: Once | INTRAVENOUS | Status: AC
  Administered 2015-11-24: 2 g via INTRAVENOUS

## 2015-11-24 MED ORDER — HYDROCODONE-ACETAMINOPHEN 5-325 MG PO TABS: 1.0000 | ORAL_TABLET | Freq: Four times a day (QID) | ORAL | Status: DC | PRN

## 2015-11-24 MED ORDER — ONDANSETRON HCL 4 MG/2ML IJ SOLN
4.0000 mg | Freq: Once | INTRAMUSCULAR | Status: DC | PRN
Start: 2015-11-24 — End: 2015-11-24

## 2015-11-24 MED ORDER — GLYCOPYRROLATE 0.2 MG/ML IJ SOLN
INTRAMUSCULAR | Status: DC | PRN
  Administered 2015-11-24: 0.1 mg via INTRAVENOUS

## 2015-11-24 MED ORDER — POTASSIUM CHLORIDE IN NACL 20-0.9 MEQ/L-% IV SOLN: INTRAVENOUS | Status: DC

## 2015-11-24 MED ORDER — LIDOCAINE HCL (PF) 1 % IJ SOLN
INTRAMUSCULAR | Status: AC
  Filled 2015-11-24: qty 30

## 2015-11-24 MED ORDER — DEXAMETHASONE SODIUM PHOSPHATE 4 MG/ML IJ SOLN
INTRAMUSCULAR | Status: DC | PRN
  Administered 2015-11-24: 10 mg via INTRAVENOUS

## 2015-11-24 MED ORDER — BUPIVACAINE-EPINEPHRINE (PF) 0.5% -1:200000 IJ SOLN
INTRAMUSCULAR | Status: AC
  Filled 2015-11-24: qty 30

## 2015-11-24 MED ORDER — BUPIVACAINE-EPINEPHRINE (PF) 0.5% -1:200000 IJ SOLN
INTRAMUSCULAR | Status: DC | PRN
  Administered 2015-11-24: 40 mL

## 2015-11-24 MED ORDER — METOCLOPRAMIDE HCL 5 MG/ML IJ SOLN: 5.0000 mg | Freq: Three times a day (TID) | INTRAMUSCULAR | Status: DC | PRN

## 2015-11-24 MED ORDER — ONDANSETRON HCL 4 MG/2ML IJ SOLN
INTRAMUSCULAR | Status: DC | PRN
  Administered 2015-11-24: 4 mg via INTRAVENOUS

## 2015-11-24 MED ORDER — FENTANYL CITRATE (PF) 100 MCG/2ML IJ SOLN
INTRAMUSCULAR | Status: DC | PRN
  Administered 2015-11-24 (×2): 25 ug via INTRAVENOUS
  Administered 2015-11-24: 50 ug via INTRAVENOUS

## 2015-11-24 MED ORDER — ONDANSETRON HCL 4 MG PO TABS: 4.0000 mg | ORAL_TABLET | Freq: Four times a day (QID) | ORAL | Status: DC | PRN

## 2015-11-24 MED ORDER — MIDAZOLAM HCL 2 MG/2ML IJ SOLN
INTRAMUSCULAR | Status: DC | PRN
Start: 2015-11-24 — End: 2015-11-24
  Administered 2015-11-24: 2 mg via INTRAVENOUS

## 2015-11-24 MED ORDER — METOCLOPRAMIDE HCL 10 MG PO TABS: 5.0000 mg | ORAL_TABLET | Freq: Three times a day (TID) | ORAL | Status: DC | PRN

## 2015-11-24 MED ORDER — FENTANYL CITRATE (PF) 100 MCG/2ML IJ SOLN: 25.0000 ug | INTRAMUSCULAR | Status: DC | PRN

## 2015-11-24 MED ORDER — CEFAZOLIN SODIUM-DEXTROSE 2-3 GM-% IV SOLR
INTRAVENOUS | Status: AC
  Filled 2015-11-24: qty 50

## 2015-11-24 MED ORDER — HYDROCODONE-ACETAMINOPHEN 5-325 MG PO TABS: 1.0000 | ORAL_TABLET | ORAL | Status: DC | PRN

## 2015-11-24 SURGICAL SUPPLY — 28 items
BAG COUNTER SPONGE EZ (MISCELLANEOUS) IMPLANT
BANDAGE ACE 6X5 VEL STRL LF (GAUZE/BANDAGES/DRESSINGS) ×2 IMPLANT
BLADE FULL RADIUS 3.5 (BLADE) ×2 IMPLANT
BLADE SHAVER 4.5X7 STR FR (MISCELLANEOUS) ×2 IMPLANT
CHLORAPREP W/TINT 26ML (MISCELLANEOUS) ×2 IMPLANT
GAUZE SPONGE 4X4 12PLY STRL (GAUZE/BANDAGES/DRESSINGS) ×2 IMPLANT
GLOVE BIO SURGEON STRL SZ8 (GLOVE) ×4 IMPLANT
GLOVE BIOGEL M 7.0 STRL (GLOVE) ×4 IMPLANT
GLOVE BIOGEL PI IND STRL 7.5 (GLOVE) ×1 IMPLANT
GLOVE BIOGEL PI INDICATOR 7.5 (GLOVE) ×1
GLOVE INDICATOR 8.0 STRL GRN (GLOVE) ×2 IMPLANT
GOWN STRL REUS W/ TWL LRG LVL3 (GOWN DISPOSABLE) ×1 IMPLANT
GOWN STRL REUS W/ TWL XL LVL3 (GOWN DISPOSABLE) ×2 IMPLANT
GOWN STRL REUS W/TWL LRG LVL3 (GOWN DISPOSABLE) ×1
GOWN STRL REUS W/TWL XL LVL3 (GOWN DISPOSABLE) ×2
IV LACTATED RINGER IRRG 3000ML (IV SOLUTION) ×1
IV LR IRRIG 3000ML ARTHROMATIC (IV SOLUTION) ×1 IMPLANT
KIT RM TURNOVER STRD PROC AR (KITS) ×2 IMPLANT
MANIFOLD NEPTUNE II (INSTRUMENTS) ×2 IMPLANT
NEEDLE HYPO 21X1.5 SAFETY (NEEDLE) ×2 IMPLANT
PACK ARTHROSCOPY KNEE (MISCELLANEOUS) ×2 IMPLANT
PAD GROUND ADULT SPLIT (MISCELLANEOUS) ×2 IMPLANT
PENCIL ELECTRO HAND CTR (MISCELLANEOUS) ×2 IMPLANT
SUT PROLENE 4 0 PS 2 18 (SUTURE) ×2 IMPLANT
SUT TI-CRON 2-0 W/10 SWGD (SUTURE) IMPLANT
SYR 50ML LL SCALE MARK (SYRINGE) ×2 IMPLANT
TUBING ARTHRO INFLOW-ONLY STRL (TUBING) ×2 IMPLANT
WAND HAND CNTRL MULTIVAC 90 (MISCELLANEOUS) ×2 IMPLANT

## 2015-11-24 NOTE — Anesthesia Preprocedure Evaluation (Addendum)
Anesthesia Evaluation  Patient identified by MRN, date of birth, ID band Patient awake    Reviewed: Allergy & Precautions, NPO status , Patient's Chart, lab work & pertinent test results, reviewed documented beta blocker date and time   Airway Mallampati: II  TM Distance: >3 FB     Dental  (+) Chipped, Missing   Pulmonary asthma , COPD, former smoker,           Cardiovascular hypertension, Pt. on medications      Neuro/Psych Anxiety    GI/Hepatic   Endo/Other    Renal/GU      Musculoskeletal  (+) Arthritis ,   Abdominal   Peds  Hematology   Anesthesia Other Findings Good neck movement. One missint tooth. Took spiriva this am.  Reproductive/Obstetrics                            Anesthesia Physical Anesthesia Plan  ASA: II  Anesthesia Plan: General   Post-op Pain Management:    Induction: Intravenous  Airway Management Planned: LMA  Additional Equipment:   Intra-op Plan:   Post-operative Plan:   Informed Consent: I have reviewed the patients History and Physical, chart, labs and discussed the procedure including the risks, benefits and alternatives for the proposed anesthesia with the patient or authorized representative who has indicated his/her understanding and acceptance.     Plan Discussed with: CRNA  Anesthesia Plan Comments:         Anesthesia Quick Evaluation

## 2015-11-24 NOTE — Op Note (Signed)
11/24/2015  4:56 PM  Patient:   Victoria Mata  Pre-Op Diagnosis:   Chondromalacia with early degenerative joint disease, right knee.  Postoperative diagnosis:   Same.  Procedure:   Arthroscopic debridement with abrasion chondroplasty of grade 3-4 chondromalacia of lateral tibial plateau and patella, right knee.  Surgeon:   Maryagnes AmosJ. Jeffrey Poggi, M.D.  Assistant:  Julieta BelliniEnoch Asiedu, PA-S  Anesthesia:   General LMA.  Findings:   As above. There was more extensive grade 3 chondromalacia involving the medial patellar facet of the patella, and grade 1-2 chondromalacia changes involving the medial tibial plateau. The femoral articular cartilage was in satisfactory condition all 3 compartments. The medial and lateral menisci were intact to probing, as were the anterior and posterior cruciate ligaments.  Complications:   None.  EBL:   5 cc.  Total fluids:   800 cc of crystalloid.  Tourniquet time:   None  Drains:   None  Closure:   4-0 Prolene interrupted sutures.  Brief clinical note:   The patient is a 54 year old female with a nearly one-year history of right knee pain and swelling following a twisting injury while at work. Her symptoms have persisted despite medications, activity modification, injections, physical therapy, etc her history and examination were consistent with a medial meniscus tear. An MRI scan demonstrated no meniscal pathology. Did demonstrate focal areas of chondromalacia. The patient presents at this time for arthroscopy, debridement, and possible microfracturing of the chondral defect(s).  Procedure:   The patient was brought into the operating room and lain in the supine position. After adequate general laryngal mask anesthesia was obtained, a timeout was performed to verify the appropriate side. The patient's right knee was injected sterilely using a solution of 30 cc of 1% lidocaine and 30 cc of 0.5% Sensorcaine with epinephrine. The right lower extremity was prepped with  ChloraPrep solution before being draped sterilely. Preoperative antibiotics were administered. The expected portal sites were injected with 0.5% Sensorcaine with epinephrine before the camera was placed in the anterolateral portal and instrumentation performed through the anteromedial portal. The knee was sequentially examined beginning in the suprapatellar pouch, then progressing to the patellofemoral space, the medial gutter compartment, the notch, and finally the lateral compartment and gutter. The findings were as described above. Abundant reactive synovial tissues anteriorly were debrided using the full-radius resector in order to improve visualization. The medial and lateral menisci were probed and found to be intact, as were the anterior and posterior cruciate ligaments. The area of grade 3-4 chondral malacia involving the central weightbearing portion of the lateral tibial plateau was debrided back to stable margins using the full-radius resector. The base was curetted down to subchondral bone using the arthroscopic curettes before several perforations were placed into the subchondral bone using the arthroscopic awls. The inflow was briefly turned off to verify that adequate bleeding was achieved. Once this was verified, the fluid was restarted. The undersurface of the patella also was studied. The larger area which measured approximately 2 cm in diameter involving medial patellar facet. This demonstrated grade 2-3 chondromalacia but no areas of unstable cartilage. Therefore, it is elected to leave it alone. Lateral more laterally and inferiorly, there was a flap lesion of cartilage which was unstable to probing, so it was debrided down to subchondral bone. Again, the subchondral bone was perforated in several locations using the arthroscopic awl. The inflow was turned off and the adequacy of bleeding from this area verified as described above. The instruments were removed from the  joint after suctioning the  excess fluid. The portal sites were closed using 4-0 Prolene interrupted sutures before a sterile bulky dressing was applied to the knee. The patient was then awakened, extubated, and returned to the recovery room in satisfactory condition after tolerating the procedure well.

## 2015-11-24 NOTE — H&P (Signed)
Paper H&P to be scanned into permanent record. H&P reviewed. No changes. 

## 2015-11-24 NOTE — Anesthesia Postprocedure Evaluation (Signed)
Anesthesia Post Note  Patient: Victoria AlcideRobin Delk  Procedure(s) Performed: Procedure(s) (LRB): KNEE ARTHROSCOPY abrasion chondraplasty, microfracture of the lateral tibia plateau and patella (Right)  Patient location during evaluation: PACU Anesthesia Type: General Level of consciousness: awake and alert Pain management: pain level controlled Vital Signs Assessment: post-procedure vital signs reviewed and stable Respiratory status: spontaneous breathing and respiratory function stable Cardiovascular status: blood pressure returned to baseline and stable Anesthetic complications: no    Last Vitals:  Filed Vitals:   11/24/15 1802 11/24/15 1816  BP: 183/85   Pulse: 71 73  Temp:    Resp: 14 16    Last Pain:  Filed Vitals:   11/24/15 1817  PainSc: 0-No pain                 KEPHART,WILLIAM K

## 2015-11-24 NOTE — Transfer of Care (Signed)
Immediate Anesthesia Transfer of Care Note  Patient: Victoria AlcideRobin Graley  Procedure(s) Performed: Procedure(s): KNEE ARTHROSCOPY abrasion chondraplasty, microfracture of the lateral tibia plateau and patella (Right)  Patient Location: PACU  Anesthesia Type:General  Level of Consciousness: awake and patient cooperative  Airway & Oxygen Therapy: Patient Spontanous Breathing and Patient connected to face mask oxygen  Post-op Assessment: Report given to RN  Post vital signs: Reviewed and stable  Last Vitals:  Filed Vitals:   11/24/15 1429 11/24/15 1702  BP: 160/92 159/96  Pulse: 92 88  Temp: 35.7 C 97.8  Resp: 16 12    Complications: No apparent anesthesia complications

## 2015-11-24 NOTE — Discharge Instructions (Addendum)
Keep dressing dry and intact.  May shower after dressing changed on post-op day #4 (Saturday).  Cover sutures with Band-Aids after drying off. Apply ice frequently to knee. Use CPM machine at least 6 hours per day set at 0-90 degrees. May toe-touch weight-bear on right leg - use crutches or walker. Follow-up in 10-14 days or as scheduled.    AMBULATORY SURGERY  DISCHARGE INSTRUCTIONS   1) The drugs that you were given will stay in your system until tomorrow so for the next 24 hours you should not:  A) Drive an automobile B) Make any legal decisions C) Drink any alcoholic beverage   2) You may resume regular meals tomorrow.  Today it is better to start with liquids and gradually work up to solid foods.  You may eat anything you prefer, but it is better to start with liquids, then soup and crackers, and gradually work up to solid foods.   3) Please notify your doctor immediately if you have any unusual bleeding, trouble breathing, redness and pain at the surgery site, drainage, fever, or pain not relieved by medication.    4) Additional Instructions:        Please contact your physician with any problems or Same Day Surgery at [phone], Monday through Friday 6 am to 4 pm, or [place] at [facility] Main number at [phone].AMBULATORY SURGERY  DISCHARGE INSTRUCTIONS   5) The drugs that you were given will stay in your system until tomorrow so for the next 24 hours you should not:  D) Drive an automobile E) Make any legal decisions F) Drink any alcoholic beverage   6) You may resume regular meals tomorrow.  Today it is better to start with liquids and gradually work up to solid foods.  You may eat anything you prefer, but it is better to start with liquids, then soup and crackers, and gradually work up to solid foods.   7) Please notify your doctor immediately if you have any unusual bleeding, trouble breathing, redness and pain at the surgery site, drainage,  fever, or pain not relieved by medication.    8) Additional Instructions:        Please contact your physician with any problems or Same Day Surgery at [phone], Monday through Friday 6 am to 4 pm, or [place] at [facility] Main number at [phone].

## 2015-11-24 NOTE — Anesthesia Procedure Notes (Signed)
Procedure Name: LMA Insertion Date/Time: 11/24/2015 3:54 PM Performed by: Michaele OfferSAVAGE, Astin Rape Pre-anesthesia Checklist: Patient identified, Emergency Drugs available, Suction available, Patient being monitored and Timeout performed Patient Re-evaluated:Patient Re-evaluated prior to inductionOxygen Delivery Method: Circle system utilized Preoxygenation: Pre-oxygenation with 100% oxygen Intubation Type: IV induction Ventilation: Mask ventilation without difficulty LMA: LMA inserted LMA Size: 3.5 Number of attempts: 1 Placement Confirmation: positive ETCO2 and breath sounds checked- equal and bilateral Tube secured with: Tape Dental Injury: Teeth and Oropharynx as per pre-operative assessment

## 2015-11-25 ENCOUNTER — Encounter: Payer: Self-pay | Admitting: Surgery

## 2016-05-06 ENCOUNTER — Ambulatory Visit
Admission: RE | Admit: 2016-05-06 | Discharge: 2016-05-06 | Disposition: A | Discharge status: Home / Self Care | Payer: 59 | Source: Ambulatory Visit | Attending: Family Medicine | Admitting: Family Medicine

## 2016-05-06 ENCOUNTER — Other Ambulatory Visit: Payer: Self-pay | Admitting: Family Medicine

## 2016-05-06 DIAGNOSIS — J441 Chronic obstructive pulmonary disease with (acute) exacerbation: Secondary | ICD-10-CM | POA: Diagnosis not present

## 2016-05-06 DIAGNOSIS — J449 Chronic obstructive pulmonary disease, unspecified: Secondary | ICD-10-CM

## 2017-04-03 ENCOUNTER — Ambulatory Visit: Payer: Self-pay | Attending: Oncology | Admitting: *Deleted

## 2017-04-03 ENCOUNTER — Ambulatory Visit
Admission: RE | Admit: 2017-04-03 | Discharge: 2017-04-03 | Disposition: A | Discharge status: Home / Self Care | Payer: Self-pay | Source: Ambulatory Visit | Attending: Oncology | Admitting: Oncology

## 2017-04-03 ENCOUNTER — Encounter: Payer: Self-pay | Admitting: *Deleted

## 2017-04-03 VITALS — BP 124/87 | HR 89 | Temp 98.6°F | Ht 64.0 in | Wt 150.0 lb

## 2017-04-03 DIAGNOSIS — Z0000 Encounter for general adult medical examination without abnormal findings: Secondary | ICD-10-CM

## 2017-04-03 NOTE — Progress Notes (Signed)
Subjective:     Patient ID: Victoria Mata, female   DOB: [birth year], 56 y.o.   MRN: [id number]  HPI   Review of Systems     Objective:   Physical Exam  Pulmonary/Chest: Right breast exhibits skin change. Right breast exhibits no inverted nipple, no mass, no nipple discharge and no tenderness. Left breast exhibits skin change. Left breast exhibits no inverted nipple, no mass, no nipple discharge and no tenderness. Breasts are symmetrical.  Bilateral breast with redness noted around the areola.       Assessment:     56 year old Black female returns to [facility] for annual screening.  Clinical breast exam reveals bilateral breast to have underlying redness noted around the areola.  There is no skin changes, warmth or dominant mass.  Taught self breast awareness, and reviewed with patient signs and symptoms of inflammatory breast cancer.  She is to call if she notes any changes.  Patients last pap was in 2016.  History of hysterectomy for fibroids.  No pap needed per protocol.Patient has been screened for eligibility.  She does not have any insurance, Medicare or Medicaid.  She also meets financial eligibility.  Hand-out given on the Affordable Care Act.    Plan:     Screening mammogram ordered.  Will follow-up per [facility] protocol.

## 2017-04-03 NOTE — Patient Instructions (Signed)
Gave patient hand-out, Women Staying Healthy, Active and Well from [facility], with education on breast health, pap smears, heart and colon health. 

## 2017-04-06 ENCOUNTER — Encounter: Payer: Self-pay | Admitting: *Deleted

## 2017-04-06 NOTE — Progress Notes (Signed)
Letter mailed from [facility] to inform patient of her normal mammogram results.  Patient is to follow-up with annual screening in one year.  HSIS to Christy. 

## 2017-09-21 ENCOUNTER — Encounter: Payer: Self-pay | Admitting: Emergency Medicine

## 2017-09-21 ENCOUNTER — Emergency Department: Payer: Self-pay

## 2017-09-21 ENCOUNTER — Emergency Department
Admission: EM | Admit: 2017-09-21 | Discharge: 2017-09-21 | Disposition: A | Discharge status: Home / Self Care | Payer: Self-pay | Attending: Emergency Medicine | Admitting: Emergency Medicine

## 2017-09-21 DIAGNOSIS — R0789 Other chest pain: Secondary | ICD-10-CM | POA: Insufficient documentation

## 2017-09-21 DIAGNOSIS — Z79899 Other long term (current) drug therapy: Secondary | ICD-10-CM | POA: Insufficient documentation

## 2017-09-21 DIAGNOSIS — G8929 Other chronic pain: Secondary | ICD-10-CM | POA: Insufficient documentation

## 2017-09-21 DIAGNOSIS — Z87891 Personal history of nicotine dependence: Secondary | ICD-10-CM | POA: Insufficient documentation

## 2017-09-21 DIAGNOSIS — J40 Bronchitis, not specified as acute or chronic: Secondary | ICD-10-CM | POA: Insufficient documentation

## 2017-09-21 DIAGNOSIS — J449 Chronic obstructive pulmonary disease, unspecified: Secondary | ICD-10-CM | POA: Insufficient documentation

## 2017-09-21 DIAGNOSIS — R911 Solitary pulmonary nodule: Secondary | ICD-10-CM | POA: Insufficient documentation

## 2017-09-21 DIAGNOSIS — I10 Essential (primary) hypertension: Secondary | ICD-10-CM | POA: Insufficient documentation

## 2017-09-21 LAB — CBC WITH DIFFERENTIAL/PLATELET
Basophils Absolute: 0.1 10*3/uL (ref 0–0.1)
Basophils Relative: 1 %
Eosinophils Absolute: 0.2 10*3/uL (ref 0–0.7)
Eosinophils Relative: 2 %
HEMATOCRIT: 40.5 % (ref 35.0–47.0)
Hemoglobin: 13.8 g/dL (ref 12.0–16.0)
LYMPHS ABS: 2.7 10*3/uL (ref 1.0–3.6)
LYMPHS PCT: 23 %
MCH: 29.1 pg (ref 26.0–34.0)
MCHC: 34 g/dL (ref 32.0–36.0)
MCV: 85.6 fL (ref 80.0–100.0)
MONO ABS: 1.4 10*3/uL — AB (ref 0.2–0.9)
MONOS PCT: 12 %
NEUTROS ABS: 7.2 10*3/uL — AB (ref 1.4–6.5)
Neutrophils Relative %: 62 %
Platelets: 354 10*3/uL (ref 150–440)
RBC: 4.73 MIL/uL (ref 3.80–5.20)
RDW: 13.8 % (ref 11.5–14.5)
WBC: 11.6 10*3/uL — ABNORMAL HIGH (ref 3.6–11.0)

## 2017-09-21 LAB — TROPONIN I: Troponin I: <0.03 ng/mL (ref <0.03)

## 2017-09-21 LAB — BASIC METABOLIC PANEL
Anion gap: 9 (ref 5–15)
BUN: 11 mg/dL (ref 6–20)
CALCIUM: 10.4 mg/dL — AB (ref 8.9–10.3)
CHLORIDE: 104 mmol/L (ref 101–111)
CO2: 26 mmol/L (ref 22–32)
CREATININE: 0.78 mg/dL (ref 0.44–1.00)
GFR calc Af Amer: >60 mL/min (ref >60)
Glucose, Bld: 102 mg/dL — ABNORMAL HIGH (ref 65–99)
POTASSIUM: 3.3 mmol/L — AB (ref 3.5–5.1)
Sodium: 139 mmol/L (ref 135–145)

## 2017-09-21 MED ORDER — IPRATROPIUM BROMIDE 0.02 % IN SOLN
0.5000 mg | Freq: Once | RESPIRATORY_TRACT | Status: AC
  Administered 2017-09-21: 0.5 mg via RESPIRATORY_TRACT
  Filled 2017-09-21: qty 2.5

## 2017-09-21 MED ORDER — IOPAMIDOL (ISOVUE-370) INJECTION 76%
75.0000 mL | Freq: Once | INTRAVENOUS | Status: AC | PRN
  Administered 2017-09-21: 75 mL via INTRAVENOUS

## 2017-09-21 MED ORDER — PREDNISONE 20 MG PO TABS: 60.0000 mg | ORAL_TABLET | Freq: Every day | ORAL | 0 refills | Status: DC

## 2017-09-21 MED ORDER — ALBUTEROL SULFATE HFA 108 (90 BASE) MCG/ACT IN AERS: 2.0000 | INHALATION_SPRAY | Freq: Four times a day (QID) | RESPIRATORY_TRACT | 2 refills | Status: AC | PRN

## 2017-09-21 MED ORDER — METHYLPREDNISOLONE SODIUM SUCC 125 MG IJ SOLR
125.0000 mg | Freq: Once | INTRAMUSCULAR | Status: AC
  Administered 2017-09-21: 125 mg via INTRAVENOUS
  Filled 2017-09-21: qty 2

## 2017-09-21 MED ORDER — ACETAMINOPHEN 325 MG PO TABS
650.0000 mg | ORAL_TABLET | Freq: Once | ORAL | Status: AC
  Administered 2017-09-21: 650 mg via ORAL
  Filled 2017-09-21: qty 2

## 2017-09-21 MED ORDER — ALBUTEROL SULFATE (2.5 MG/3ML) 0.083% IN NEBU
5.0000 mg | INHALATION_SOLUTION | Freq: Once | RESPIRATORY_TRACT | Status: AC
  Administered 2017-09-21: 2.5 mg via RESPIRATORY_TRACT
  Filled 2017-09-21: qty 6

## 2017-09-21 NOTE — Discharge Instructions (Addendum)
You have several small pulmonary nodules, largest measuring 7 mm. Non-contrast chest CT at 3-6 months is recommended. If the nodules are stable at time of repeat CT, then future CT at 18-24 months (from today's scan) is considered optional for low-risk patients, but is recommended for high-risk patients.

## 2017-09-21 NOTE — ED Provider Notes (Signed)
[facility] Emergency Department Provider Note  ____________________________________________   I have reviewed the triage vital signs and the nursing notes.   HISTORY  Chief Complaint Shortness of Breath    HPI Victoria Mata is a 56 y.o. female who presents today complaining of cough and wheeze and pain in her chest tightness. Patient states that she has had a cold for the last several days. She's been coughing with a nonproductive cough. She feels that her COPD is acting up. She also has a history of childhood asthma. Today, she was driving in a hot car and she became acutely short of breath, while coughing. She began to have chest pain that was worse when she coughed. She opened her windows and felt a little bit better. The pain was mostly on the right side, it was somewhat pleuritic, no leg pain or swelling, no personal or family history of PE or DVT, no recent travel or supplemental estrogens. Patient has had similar episodes with her COPD before. She complains of wheezing at this time. Also pain to her ribs on the right from coughing.   the pain is sharp and worse when she touches or changes position, better when she sits still,, has tried nothing for the symptoms prior to coming in.  Past Medical History:  Diagnosis Date  . Anxiety   . Asthma   . Cervical spine arthritis (HCC)    with a neck infusion  . Chronic back pain   . COPD (chronic obstructive pulmonary disease) (HCC)    stage 1  . Environmental allergies   . HTN (hypertension)    not currently - weight induced if over 150lb  . Sciatica     Patient Active Problem List   Diagnosis Date Noted  . Wound infection after surgery 04/06/2011    Past Surgical History:  Procedure Laterality Date  . COLON RESECTION    . KNEE ARTHROSCOPY    . KNEE ARTHROSCOPY WITH MENISCAL REPAIR Right 11/24/2015   Procedure: KNEE ARTHROSCOPY abrasion chondraplasty, microfracture of the lateral tibia plateau and  patella;  Surgeon: Christena Flake, MD;  Location: [facility] ORS;  Service: Orthopedics;  Laterality: Right;  . LAPAROSCOPY    . neck fusion    . TOTAL ABDOMINAL HYSTERECTOMY      Prior to Admission medications   Medication Sig Start Date End Date Taking? Authorizing Provider  HYDROcodone-acetaminophen (NORCO) 5-325 MG tablet Take 1-2 tablets by mouth every 6 (six) hours as needed for moderate pain. MAXIMUM TOTAL ACETAMINOPHEN DOSE IS 4000 MG PER DAY 11/24/15   Poggi, Excell Seltzer, MD  ibuprofen (ADVIL,MOTRIN) 800 MG tablet Take 800 mg by mouth every 8 (eight) hours as needed.    [provider]  tiotropium (SPIRIVA HANDIHALER) 18 MCG inhalation capsule Place 18 mcg into inhaler and inhale daily.    [provider]    Allergies Chocolate  Family History  Problem Relation Age of Onset  . Heart attack Father   . Cancer Mother        squamous cell cancer of head and neck  . Breast cancer Neg Hx     Social History Social History  Substance Use Topics  . Smoking status: Former Smoker    Packs/day: 0.30    Years: 15.00    Types: Cigarettes    Quit date: 10/16/2015  . Smokeless tobacco: Never Used  . Alcohol use 0.5 oz/week    1 Standard drinks or equivalent per week     Comment: wine  Review of Systems Constitutional: No fever/chills Eyes: No visual changes. ENT: No sore throat. No stiff neck no neck pain Cardiovascular: positive chest pain. Respiratory: positiveshortness of breath. Gastrointestinal:   no vomiting.  No diarrhea.  No constipation. Genitourinary: Negative for dysuria. Musculoskeletal: Negative lower extremity swelling Skin: Negative for rash. Neurological: Negative for severe headaches, focal weakness or numbness.   ____________________________________________   PHYSICAL EXAM:  VITAL SIGNS: ED Triage Vitals  Enc Vitals Group     BP 09/21/17 1112 (!) 156/104     Pulse Rate 09/21/17 1112 (!) 117     Resp 09/21/17 1112 (!) 24     Temp 09/21/17  1112 99.1 F (37.3 C)     Temp Source 09/21/17 1112 Oral     SpO2 09/21/17 1112 97 %     Weight 09/21/17 1112 145 lb (65.8 kg)     Height 09/21/17 1112  (1.651 m)     Head Circumference --      Peak Flow --      Pain Score 09/21/17 1111 8     Pain Loc --      Pain Edu? --      Excl. in GC? --     Constitutional: Alert and oriented. Well appearing and in no acute distress.somewhat anxious Eyes: Conjunctivae are normal Head: Atraumatic HEENT: No congestion/rhinnorhea. Mucous membranes are moist.  Oropharynx non-erythematous Neck:   Nontender with no meningismus, no masses, no stridor Cardiovascular: Normal rate, regular rhythm. Grossly normal heart sounds.  Good peripheral circulation. chest: Tender palpation to the chest wall on the right, when I touch this area, patient states "ouch that's the pain right there", no flail chest crepitus, no shingles lesions noted. Female chaperone present for exam. Respiratory: Normal respiratory effort.  there is diffuse mild wheeze, no rales or rhonchi Abdominal: Soft and nontender. No distention. No guarding no rebound Back:  There is no focal tenderness or step off.  there is no midline tenderness there are no lesions noted. there is no CVA tenderness Musculoskeletal: No lower extremity tenderness, no upper extremity tenderness. No joint effusions, no DVT signs strong distal pulses no edema Neurologic:  Normal speech and language. No gross focal neurologic deficits are appreciated.  Skin:  Skin is warm, dry and intact. No rash noted. Psychiatric: Mood and affect are anxious. Speech and behavior are normal.  ____________________________________________   LABS (all labs ordered are listed, but only abnormal results are displayed)  Labs Reviewed  CBC WITH DIFFERENTIAL/PLATELET - Abnormal; Notable for the following:       Result Value   WBC 11.6 (*)    Neutro Abs 7.2 (*)    Monocytes Absolute 1.4 (*)    All other components within normal  limits  BASIC METABOLIC PANEL - Abnormal; Notable for the following:    Potassium 3.3 (*)    Glucose, Bld 102 (*)    Calcium 10.4 (*)    All other components within normal limits  TROPONIN I    Pertinent labs  results that were available during my care of the patient were reviewed by me and considered in my medical decision making (see chart for details). ____________________________________________  EKG  I personally interpreted any EKGs ordered by me or triage sinus rhythm rate 87 bpm normal axis no acute ST elevation or acute ST depression unremarkable EKG ____________________________________________  RADIOLOGY  Pertinent labs & imaging results that were available during my care of the patient were reviewed by me and considered in my  medical decision making (see chart for details). If possible, patient and/or family made aware of any abnormal findings. ____________________________________________    PROCEDURES  Procedure(s) performed: None  Procedures  Critical Care performed: None  ____________________________________________   INITIAL IMPRESSION / ASSESSMENT AND PLAN / ED COURSE  Pertinent labs & imaging results that were available during my care of the patient were reviewed by me and considered in my medical decision making (see chart for details).  patient here with cough and wheeze, we did give her steroids and albuterol and she feels much better. Pain is reduced after Tylenol. Low suspicion for ACS given history however given sudden onset of pleuritic chest pain, even though patient does have COPD, there does exist concern about PE. I will obtain further imaging to determine the presence of the abdomen if that is negative, is my hope that we can do serial enzymes and discharge the patient as she is feeling much better.    ____________________________________________   FINAL CLINICAL IMPRESSION(S) / ED DIAGNOSES  Final diagnoses:  None      This chart was  dictated using voice recognition software.  Despite best efforts to proofread,  errors can occur which can change meaning.      Jeanmarie Plant, MD 09/21/17 1250

## 2017-09-21 NOTE — ED Notes (Signed)
When patient resting with eyes closed, oxygen 89% on RA. Pt placed on 2L [place] at this time. Pt in NAD, RR even and unlabored, color WNL.

## 2017-09-21 NOTE — ED Triage Notes (Signed)
Pt arrived POV with obvious dyspnea. Pt reports sudden onset of pain between shoulder blades and shortness of breath while driving today. Pt pulse in triage 117,room air SpO2 97%.

## 2017-09-21 NOTE — ED Notes (Signed)
AAOx3.  Skin warm and dry.  Ambulated on room air, tolerated well.  No SOB/ DOE.  Voice clear and strong.  Speaking in full sentences.  NAD

## 2018-06-01 ENCOUNTER — Other Ambulatory Visit: Payer: Self-pay | Admitting: Family Medicine

## 2018-06-01 DIAGNOSIS — R918 Other nonspecific abnormal finding of lung field: Secondary | ICD-10-CM

## 2018-06-12 ENCOUNTER — Other Ambulatory Visit: Payer: Self-pay | Admitting: Family Medicine

## 2018-06-12 ENCOUNTER — Ambulatory Visit: Payer: BLUE CROSS/BLUE SHIELD

## 2018-06-12 DIAGNOSIS — Z1231 Encounter for screening mammogram for malignant neoplasm of breast: Secondary | ICD-10-CM

## 2018-07-06 ENCOUNTER — Ambulatory Visit
Admission: RE | Admit: 2018-07-06 | Discharge: 2018-07-06 | Disposition: A | Discharge status: Home / Self Care | Payer: BLUE CROSS/BLUE SHIELD | Source: Ambulatory Visit | Attending: Family Medicine | Admitting: Family Medicine

## 2018-07-06 DIAGNOSIS — Z1231 Encounter for screening mammogram for malignant neoplasm of breast: Secondary | ICD-10-CM | POA: Diagnosis not present

## 2018-08-30 ENCOUNTER — Telehealth: Payer: Self-pay | Admitting: *Deleted

## 2018-08-30 NOTE — Telephone Encounter (Signed)
Called patient and discussed since she had had a CT at [facility] that recommended 1 year followup from July 2019 that we would now do her LDCT screening in July 2020.  Voiced understanding.

## 2018-08-30 NOTE — Telephone Encounter (Signed)
Received referral for low dose lung cancer screening CT scan.  Message left at phone number listed in EMR for patient to call either myself or Shawn Perkins back at [phone] to facilitate scheduling the scan.    

## 2019-05-16 ENCOUNTER — Other Ambulatory Visit: Payer: Self-pay | Admitting: Family Medicine

## 2019-05-16 ENCOUNTER — Other Ambulatory Visit: Payer: Self-pay

## 2019-05-16 ENCOUNTER — Ambulatory Visit
Admission: RE | Admit: 2019-05-16 | Discharge: 2019-05-16 | Disposition: A | Discharge status: Home / Self Care | Payer: BLUE CROSS/BLUE SHIELD | Source: Ambulatory Visit | Attending: Family Medicine | Admitting: Family Medicine

## 2019-05-16 DIAGNOSIS — R0789 Other chest pain: Secondary | ICD-10-CM

## 2019-05-16 DIAGNOSIS — R0689 Other abnormalities of breathing: Secondary | ICD-10-CM | POA: Diagnosis present

## 2019-05-16 DIAGNOSIS — R1011 Right upper quadrant pain: Secondary | ICD-10-CM

## 2019-05-22 ENCOUNTER — Ambulatory Visit
Admission: RE | Admit: 2019-05-22 | Discharge: 2019-05-22 | Disposition: A | Discharge status: Home / Self Care | Payer: BLUE CROSS/BLUE SHIELD | Source: Ambulatory Visit | Attending: Family Medicine | Admitting: Family Medicine

## 2019-05-22 ENCOUNTER — Other Ambulatory Visit: Payer: Self-pay

## 2019-05-22 DIAGNOSIS — R1011 Right upper quadrant pain: Secondary | ICD-10-CM | POA: Diagnosis present

## 2019-06-06 ENCOUNTER — Other Ambulatory Visit: Payer: Self-pay | Admitting: Family Medicine

## 2019-06-06 DIAGNOSIS — R1011 Right upper quadrant pain: Secondary | ICD-10-CM

## 2019-07-09 ENCOUNTER — Ambulatory Visit
Admission: RE | Admit: 2019-07-09 | Discharge: 2019-07-09 | Disposition: A | Discharge status: Home / Self Care | Payer: BC Managed Care – PPO | Source: Ambulatory Visit | Attending: Family Medicine | Admitting: Family Medicine

## 2019-07-09 ENCOUNTER — Other Ambulatory Visit: Payer: Self-pay

## 2019-07-09 DIAGNOSIS — R1011 Right upper quadrant pain: Secondary | ICD-10-CM | POA: Insufficient documentation

## 2019-07-09 MED ORDER — TECHNETIUM TC 99M MEBROFENIN IV KIT
5.5490 | PACK | Freq: Once | INTRAVENOUS | Status: AC | PRN
  Administered 2019-07-09: 5.549 via INTRAVENOUS

## 2019-08-31 ENCOUNTER — Other Ambulatory Visit: Payer: Self-pay

## 2019-08-31 DIAGNOSIS — I10 Essential (primary) hypertension: Secondary | ICD-10-CM | POA: Insufficient documentation

## 2019-08-31 DIAGNOSIS — Z20828 Contact with and (suspected) exposure to other viral communicable diseases: Secondary | ICD-10-CM | POA: Insufficient documentation

## 2019-08-31 DIAGNOSIS — R1310 Dysphagia, unspecified: Secondary | ICD-10-CM | POA: Insufficient documentation

## 2019-08-31 DIAGNOSIS — J449 Chronic obstructive pulmonary disease, unspecified: Secondary | ICD-10-CM | POA: Diagnosis not present

## 2019-08-31 DIAGNOSIS — Z87891 Personal history of nicotine dependence: Secondary | ICD-10-CM | POA: Diagnosis not present

## 2019-08-31 DIAGNOSIS — R0602 Shortness of breath: Secondary | ICD-10-CM | POA: Diagnosis present

## 2019-08-31 DIAGNOSIS — Z79899 Other long term (current) drug therapy: Secondary | ICD-10-CM | POA: Insufficient documentation

## 2019-08-31 DIAGNOSIS — J45909 Unspecified asthma, uncomplicated: Secondary | ICD-10-CM | POA: Insufficient documentation

## 2019-09-01 ENCOUNTER — Emergency Department: Payer: BC Managed Care – PPO

## 2019-09-01 ENCOUNTER — Other Ambulatory Visit: Payer: Self-pay

## 2019-09-01 ENCOUNTER — Encounter: Payer: Self-pay | Admitting: Emergency Medicine

## 2019-09-01 ENCOUNTER — Emergency Department
Admission: EM | Admit: 2019-09-01 | Discharge: 2019-09-01 | Disposition: A | Discharge status: Home / Self Care | Payer: BC Managed Care – PPO | Attending: Emergency Medicine | Admitting: Emergency Medicine

## 2019-09-01 DIAGNOSIS — R1310 Dysphagia, unspecified: Secondary | ICD-10-CM

## 2019-09-01 LAB — SARS CORONAVIRUS 2 (TAT 6-24 HRS): SARS Coronavirus 2: NEGATIVE

## 2019-09-01 NOTE — ED Triage Notes (Signed)
Pt reports shortness of breath since 4pm today; nonproductive cough; says she's having trouble swallowing her mucus but can swallow water; this started yesterday; pt talking in complete coherent sentences; denies sinus congestion or fevers;

## 2019-09-01 NOTE — ED Provider Notes (Signed)
[facility] Emergency Department Provider Note   ____________________________________________   First MD Initiated Contact with Patient 09/01/19 [phone]     (approximate)  I have reviewed the triage vital signs and the nursing notes.   HISTORY  Chief Complaint Shortness of Breath, Cough, and Sore Throat    HPI Victoria Mata is a 58 y.o. female who presents to the ED from home with a chief complaint of difficulty swallowing saliva leading to her feeling short of breath. Patient states this has happened before. Tonight she was awakened from sleep because she could not swallow her saliva which made her anxious and feel short of breath. She was able to drink water without difficulty, vomiting or choking. States her saliva is usually thick. Denies facial droop, slurred speech, extremity weakness. Denies fever, cough, chest pain, abdominal pain, nausea or vomiting. 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       Past Medical History:  Diagnosis Date  . Anxiety   . Asthma   . Cervical spine arthritis    with a neck infusion  . Chronic back pain   . COPD (chronic obstructive pulmonary disease) (HCC)    stage 1  . Environmental allergies   . HTN (hypertension)    not currently - weight induced if over 150lb  . Sciatica     Patient Active Problem List   Diagnosis Date Noted  . Wound infection after surgery 04/06/2011    Past Surgical History:  Procedure Laterality Date  . COLON RESECTION    . KNEE ARTHROSCOPY    . KNEE ARTHROSCOPY WITH MENISCAL REPAIR Right 11/24/2015   Procedure: KNEE ARTHROSCOPY abrasion chondraplasty, microfracture of the lateral tibia plateau and patella;  Surgeon: Christena FlakeJohn J Poggi, MD;  Location: [facility] ORS;  Service: Orthopedics;  Laterality: Right;  . LAPAROSCOPY    . neck fusion    . TOTAL ABDOMINAL HYSTERECTOMY      Prior to Admission medications   Medication Sig  Start Date End Date Taking? Authorizing Provider  albuterol (PROVENTIL HFA;VENTOLIN HFA) 108 (90 Base) MCG/ACT inhaler Inhale 2 puffs into the lungs every 6 (six) hours as needed for wheezing or shortness of breath. 09/21/17  Yes Jeanmarie PlantMcShane, James A, MD  cyproheptadine (PERIACTIN) 4 MG tablet Take 4 mg by mouth 2 (two) times daily.   Yes [provider]  lisinopril (ZESTRIL) 2.5 MG tablet Take 2.5 mg by mouth every other day.   Yes [provider]  tiotropium (SPIRIVA HANDIHALER) 18 MCG inhalation capsule Place 18 mcg into inhaler and inhale daily.   Yes [provider]    Allergies Chocolate  Family History  Problem Relation Age of Onset  . Heart attack Father   . Cancer Mother        squamous cell cancer of head and neck  . Breast cancer Neg Hx     Social History Social History   Tobacco Use  . Smoking status: Former Smoker    Packs/day: 0.30    Years: 15.00    Pack years: 4.50    Types: Cigarettes    Quit date: 10/16/2015    Years since quitting: 3.8  . Smokeless tobacco: Never Used  Substance Use Topics  . Alcohol use: Yes    Alcohol/week: 1.0 standard drinks    Types: 1 Standard drinks or equivalent per week    Comment: wine  . Drug use: No    Review of  Systems  Constitutional: No fever/chills Eyes: No visual changes. ENT: Positive for dysphagia. No sore throat. Cardiovascular: Denies chest pain. Respiratory: Denies shortness of breath. Gastrointestinal: No abdominal pain.  No nausea, no vomiting.  No diarrhea.  No constipation. Genitourinary: Negative for dysuria. Musculoskeletal: Negative for back pain. Skin: Negative for rash. Neurological: Negative for headaches, focal weakness or numbness.   ____________________________________________   PHYSICAL EXAM:  VITAL SIGNS: ED Triage Vitals  Enc Vitals Group     BP 09/01/19 0014 (!) 174/108     Pulse Rate 09/01/19 0014 90     Resp 09/01/19 0014 16     Temp 09/01/19 0014 98.7 F  (37.1 C)     Temp Source 09/01/19 0014 Oral     SpO2 09/01/19 0014 98 %     Weight 09/01/19 0017 145 lb (65.8 kg)     Height 09/01/19 0017 5\' 5"  (1.651 m)     Head Circumference --      Peak Flow --      Pain Score 09/01/19 0016 7     Pain Loc --      Pain Edu? --      Excl. in GC? --     Constitutional: Alert and oriented. Well appearing and in no acute distress. Eyes: Conjunctivae are normal. PERRL. EOMI. Head: Atraumatic. Nose: No congestion/rhinnorhea. Mouth/Throat: Mucous membranes are moist.  Oropharynx non-erythematous.  No visible FB in oropharynx. Tolerating secretions fine. Drinking water without difficulty. Neck: No stridor.  No palpable masses. Cardiovascular: Normal rate, regular rhythm. Grossly normal heart sounds.  Good peripheral circulation. Respiratory: Normal respiratory effort.  No retractions. Lungs CTAB. Gastrointestinal: Soft and nontender. No distention. No abdominal bruits. No CVA tenderness. Musculoskeletal: No lower extremity tenderness nor edema.  No joint effusions. Neurologic:  Normal speech and language. No gross focal neurologic deficits are appreciated. No gait instability. Skin:  Skin is warm, dry and intact. No rash noted. Psychiatric: Mood and affect are normal. Speech and behavior are normal.  ____________________________________________   LABS (all labs ordered are listed, but only abnormal results are displayed)  Labs Reviewed  SARS CORONAVIRUS 2 (TAT 6-24 HRS)   ____________________________________________  EKG  ED ECG REPORT I, Faysal Fenoglio J, the attending physician, personally viewed and interpreted this ECG.   Date: 09/01/2019  EKG Time: 0024  Rate: 88  Rhythm: normal EKG, normal sinus rhythm  Axis: Normal  Intervals:none  ST&T Change: Nonspecific ____________________________________________  RADIOLOGY  ED MD interpretation:  No acute cardiopulmonary process; unremarkable soft tissue neck  Official radiology report(s):  Dg Neck Soft Tissue  Result Date: 09/01/2019 CLINICAL DATA:  Shortness of breath, cough.  Difficulty swallowing. EXAM: NECK SOFT TISSUES - 1+ VIEW COMPARISON:  CT 02/20/2011 FINDINGS: Changes of prior anterior cervical fusion from C3-C6. Prevertebral soft tissues are normal. Large anterior osteophyte at C2-3. Epiglottis and aryepiglottic folds are normal. Airways patent. No radiopaque foreign body. IMPRESSION: Prior anterior cervical fusion C3-C6. No acute bony abnormality. No soft tissue abnormality. Electronically Signed   By: Charlett Nose M.D.   On: 09/01/2019 04:00   Dg Chest 2 View  Result Date: 09/01/2019 CLINICAL DATA:  Cough and shortness of breath EXAM: CHEST - 2 VIEW COMPARISON:  May 16, 2019 FINDINGS: The heart size and mediastinal contours are within normal limits. Both lungs are clear. The visualized skeletal structures are unremarkable. IMPRESSION: No acute cardiopulmonary disease. Electronically Signed   By: Jonna Clark M.D.   On: 09/01/2019 01:01    ____________________________________________   PROCEDURES  Procedure(s) performed (including Critical Care):  Procedures   ____________________________________________   INITIAL IMPRESSION / ASSESSMENT AND PLAN / ED COURSE  As part of my medical decision making, I reviewed the following data within the [facility] notes reviewed and incorporated, Old chart reviewed, Radiograph reviewed and Notes from prior ED visits     Elwyn Lowden was evaluated in Emergency Department on 09/01/2019 for the symptoms described in the history of present illness. She was evaluated in the context of the global COVID-19 pandemic, which necessitated consideration that the patient might be at risk for infection with the SARS-CoV-2 virus that causes COVID-19. Institutional protocols and algorithms that pertain to the evaluation of patients at risk for COVID-19 are in a state of rapid change based on information released by  regulatory bodies including the CDC and federal and state organizations. These policies and algorithms were followed during the patient's care in the ED.    58 year old female who presents with difficulty swallowing saliva. No difficulty swallowing water which she drinks in my presence. CXR unremarkable. Will obtain xray soft tissue neck, give glycerin swab to promote salivation. Obtain 24 hour Covid swab. We went over her medications and she is on Lisinopril as well as MDIs which may contribute to her thickened saliva and difficulty swallowing saliva.   Clinical Course as of Sep 01 431  Sun Sep 01, 2019  0431 Updated patient of neck xray. Will refer to GI for further dysphagia workup. Strict return precautions given. Patient verbalizes understanding and agrees with plan of care.   [JS]    Clinical Course User Index [JS] Paulette Blanch, MD     ____________________________________________   FINAL CLINICAL IMPRESSION(S) / ED DIAGNOSES  Final diagnoses:  Dysphagia, unspecified type     ED Discharge Orders    None       Note:  This document was prepared using Dragon voice recognition software and may include unintentional dictation errors.   Paulette Blanch, MD 09/01/19 [phone]

## 2019-09-01 NOTE — Discharge Instructions (Signed)
Return to the ER for worsening symptoms, persistent vomiting, difficulty breathing or other concerns. °

## 2019-09-01 NOTE — ED Notes (Signed)
Patient states she woke with feeling of being unable to swallow her saliva.  Patient reports she became anxious and short of breath.  Patient is able to speak in complete sentences without difficulty, no drooling noted.

## 2019-10-13 ENCOUNTER — Emergency Department (HOSPITAL_COMMUNITY): Payer: BC Managed Care – PPO

## 2019-10-13 ENCOUNTER — Emergency Department (HOSPITAL_COMMUNITY)
Admission: EM | Admit: 2019-10-13 | Discharge: 2019-10-13 | Disposition: A | Discharge status: Home / Self Care | Payer: BC Managed Care – PPO | Attending: Emergency Medicine | Admitting: Emergency Medicine

## 2019-10-13 ENCOUNTER — Encounter (HOSPITAL_COMMUNITY): Payer: Self-pay

## 2019-10-13 DIAGNOSIS — R0989 Other specified symptoms and signs involving the circulatory and respiratory systems: Secondary | ICD-10-CM | POA: Insufficient documentation

## 2019-10-13 DIAGNOSIS — S91311A Laceration without foreign body, right foot, initial encounter: Secondary | ICD-10-CM

## 2019-10-13 DIAGNOSIS — M25511 Pain in right shoulder: Secondary | ICD-10-CM | POA: Insufficient documentation

## 2019-10-13 DIAGNOSIS — S99921A Unspecified injury of right foot, initial encounter: Secondary | ICD-10-CM | POA: Diagnosis present

## 2019-10-13 DIAGNOSIS — S6992XA Unspecified injury of left wrist, hand and finger(s), initial encounter: Secondary | ICD-10-CM | POA: Insufficient documentation

## 2019-10-13 DIAGNOSIS — Z23 Encounter for immunization: Secondary | ICD-10-CM | POA: Insufficient documentation

## 2019-10-13 DIAGNOSIS — I10 Essential (primary) hypertension: Secondary | ICD-10-CM | POA: Diagnosis not present

## 2019-10-13 DIAGNOSIS — M79662 Pain in left lower leg: Secondary | ICD-10-CM | POA: Diagnosis not present

## 2019-10-13 DIAGNOSIS — Y92410 Unspecified street and highway as the place of occurrence of the external cause: Secondary | ICD-10-CM | POA: Insufficient documentation

## 2019-10-13 DIAGNOSIS — M549 Dorsalgia, unspecified: Secondary | ICD-10-CM | POA: Diagnosis not present

## 2019-10-13 DIAGNOSIS — J45909 Unspecified asthma, uncomplicated: Secondary | ICD-10-CM | POA: Insufficient documentation

## 2019-10-13 DIAGNOSIS — Y906 Blood alcohol level of 120-199 mg/100 ml: Secondary | ICD-10-CM | POA: Diagnosis not present

## 2019-10-13 DIAGNOSIS — S0181XA Laceration without foreign body of other part of head, initial encounter: Secondary | ICD-10-CM | POA: Insufficient documentation

## 2019-10-13 DIAGNOSIS — Y999 Unspecified external cause status: Secondary | ICD-10-CM | POA: Insufficient documentation

## 2019-10-13 DIAGNOSIS — G4751 Confusional arousals: Secondary | ICD-10-CM | POA: Insufficient documentation

## 2019-10-13 DIAGNOSIS — S025XXA Fracture of tooth (traumatic), initial encounter for closed fracture: Secondary | ICD-10-CM | POA: Diagnosis not present

## 2019-10-13 DIAGNOSIS — S7011XA Contusion of right thigh, initial encounter: Secondary | ICD-10-CM | POA: Diagnosis not present

## 2019-10-13 DIAGNOSIS — F1099 Alcohol use, unspecified with unspecified alcohol-induced disorder: Secondary | ICD-10-CM | POA: Diagnosis not present

## 2019-10-13 DIAGNOSIS — Y9389 Activity, other specified: Secondary | ICD-10-CM | POA: Diagnosis not present

## 2019-10-13 DIAGNOSIS — S7012XA Contusion of left thigh, initial encounter: Secondary | ICD-10-CM | POA: Diagnosis not present

## 2019-10-13 HISTORY — DX: Unspecified asthma, uncomplicated: J45.909

## 2019-10-13 HISTORY — DX: Essential (primary) hypertension: I10

## 2019-10-13 LAB — SAMPLE TO BLOOD BANK

## 2019-10-13 LAB — I-STAT CHEM 8, ED
BUN: 15 mg/dL (ref 6–20)
Calcium, Ion: 1.41 mmol/L — ABNORMAL HIGH (ref 1.15–1.40)
Chloride: 110 mmol/L (ref 98–111)
Creatinine, Ser: 1.4 mg/dL — ABNORMAL HIGH (ref 0.44–1.00)
Glucose, Bld: 109 mg/dL — ABNORMAL HIGH (ref 70–99)
HCT: 48 % — ABNORMAL HIGH (ref 36.0–46.0)
Hemoglobin: 16.3 g/dL — ABNORMAL HIGH (ref 12.0–15.0)
Potassium: 3.5 mmol/L (ref 3.5–5.1)
Sodium: 143 mmol/L (ref 135–145)
TCO2: 19 mmol/L — ABNORMAL LOW (ref 22–32)

## 2019-10-13 LAB — COMPREHENSIVE METABOLIC PANEL
ALT: 29 U/L (ref 0–44)
AST: 51 U/L — ABNORMAL HIGH (ref 15–41)
Albumin: 3.8 g/dL (ref 3.5–5.0)
Alkaline Phosphatase: 118 U/L (ref 38–126)
Anion gap: 12 (ref 5–15)
BUN: 14 mg/dL (ref 6–20)
CO2: 19 mmol/L — ABNORMAL LOW (ref 22–32)
Calcium: 10.7 mg/dL — ABNORMAL HIGH (ref 8.9–10.3)
Chloride: 110 mmol/L (ref 98–111)
Creatinine, Ser: 1.23 mg/dL — ABNORMAL HIGH (ref 0.44–1.00)
GFR calc Af Amer: 56 mL/min — ABNORMAL LOW (ref >60)
GFR calc non Af Amer: 48 mL/min — ABNORMAL LOW (ref >60)
Glucose, Bld: 113 mg/dL — ABNORMAL HIGH (ref 70–99)
Potassium: 3.5 mmol/L (ref 3.5–5.1)
Sodium: 141 mmol/L (ref 135–145)
Total Bilirubin: 0.6 mg/dL (ref 0.3–1.2)
Total Protein: 6.6 g/dL (ref 6.5–8.1)

## 2019-10-13 LAB — URINALYSIS, ROUTINE W REFLEX MICROSCOPIC
Bacteria, UA: NONE SEEN
Bilirubin Urine: NEGATIVE
Glucose, UA: NEGATIVE mg/dL
Ketones, ur: NEGATIVE mg/dL
Leukocytes,Ua: NEGATIVE
Nitrite: NEGATIVE
Protein, ur: NEGATIVE mg/dL
Specific Gravity, Urine: 1.02 (ref 1.005–1.030)
pH: 6 (ref 5.0–8.0)

## 2019-10-13 LAB — CBG MONITORING, ED: Glucose-Capillary: 111 mg/dL — ABNORMAL HIGH (ref 70–99)

## 2019-10-13 LAB — CBC
HCT: 42.9 % (ref 36.0–46.0)
Hemoglobin: 14.3 g/dL (ref 12.0–15.0)
MCH: 28.7 pg (ref 26.0–34.0)
MCHC: 33.3 g/dL (ref 30.0–36.0)
MCV: 86.1 fL (ref 80.0–100.0)
Platelets: 347 10*3/uL (ref 150–400)
RBC: 4.98 MIL/uL (ref 3.87–5.11)
RDW: 14.3 % (ref 11.5–15.5)
WBC: 15.7 10*3/uL — ABNORMAL HIGH (ref 4.0–10.5)
nRBC: 0 % (ref 0.0–0.2)

## 2019-10-13 LAB — LACTIC ACID, PLASMA
Lactic Acid, Venous: 3.4 mmol/L (ref 0.5–1.9)
Lactic Acid, Venous: 3 mmol/L (ref 0.5–1.9)

## 2019-10-13 LAB — ETHANOL: Alcohol, Ethyl (B): 177 mg/dL — ABNORMAL HIGH (ref <10)

## 2019-10-13 LAB — PROTIME-INR
INR: 1.1 (ref 0.8–1.2)
Prothrombin Time: 14.1 seconds (ref 11.4–15.2)

## 2019-10-13 LAB — CDS SEROLOGY

## 2019-10-13 MED ORDER — HYDROMORPHONE HCL 1 MG/ML IJ SOLN
0.5000 mg | Freq: Once | INTRAMUSCULAR | Status: AC
  Administered 2019-10-13: 0.5 mg via INTRAVENOUS
  Filled 2019-10-13: qty 1

## 2019-10-13 MED ORDER — IOHEXOL 300 MG/ML  SOLN
100.0000 mL | Freq: Once | INTRAMUSCULAR | Status: AC | PRN
  Administered 2019-10-13: 100 mL via INTRAVENOUS

## 2019-10-13 MED ORDER — FENTANYL CITRATE (PF) 100 MCG/2ML IJ SOLN: 50.0000 ug | INTRAMUSCULAR | Status: DC | PRN

## 2019-10-13 MED ORDER — OXYCODONE-ACETAMINOPHEN 5-325 MG PO TABS
1.0000 | ORAL_TABLET | Freq: Once | ORAL | Status: AC
  Administered 2019-10-13: 1 via ORAL
  Filled 2019-10-13: qty 1

## 2019-10-13 MED ORDER — OXYCODONE-ACETAMINOPHEN 5-325 MG PO TABS: 1.0000 | ORAL_TABLET | ORAL | 0 refills | Status: AC | PRN

## 2019-10-13 MED ORDER — LIDOCAINE HCL (PF) 1 % IJ SOLN
30.0000 mL | Freq: Once | INTRAMUSCULAR | Status: AC
  Administered 2019-10-13: 30 mL
  Filled 2019-10-13: qty 30

## 2019-10-13 MED ORDER — SODIUM CHLORIDE 0.9 % IV BOLUS
1000.0000 mL | Freq: Once | INTRAVENOUS | Status: AC
  Administered 2019-10-13: 1000 mL via INTRAVENOUS

## 2019-10-13 MED ORDER — TETANUS-DIPHTH-ACELL PERTUSSIS 5-2.5-18.5 LF-MCG/0.5 IM SUSP
0.5000 mL | Freq: Once | INTRAMUSCULAR | Status: AC
  Administered 2019-10-13: 0.5 mL via INTRAMUSCULAR
  Filled 2019-10-13: qty 0.5

## 2019-10-13 MED ORDER — LACTATED RINGERS IV BOLUS
1000.0000 mL | Freq: Once | INTRAVENOUS | Status: AC
  Administered 2019-10-13: 1000 mL via INTRAVENOUS

## 2019-10-13 NOTE — Care Management (Signed)
ED CM received a call from [place] at [facility]. Melissa would like to add "max of 6 tablets daily" to the patient's prescription for Percocet. Chris L. PA agrees, CM updated Melissa.

## 2019-10-13 NOTE — Progress Notes (Signed)
Orthopedic Tech Progress Note Patient Details:  Victoria Mata Jul 02, 1961 [id number]  Ortho Devices Type of Ortho Device: Arm sling, Sugartong splint Ortho Device/Splint Location: lue. applied post reduction. Ortho Device/Splint Interventions: Ordered, Application, Adjustment   Post Interventions Patient Tolerated: Well Instructions Provided: Care of device, Adjustment of device   Karolee Stamps 10/13/2019, 6:01 AM

## 2019-10-13 NOTE — Progress Notes (Signed)
   10/13/19 0101  Clinical Encounter Type  Visited With Health care provider  Visit Type Initial;ED;Trauma  Referral From Nurse   Chaplain responded to a trauma in the ED. No family is present at this time. Chaplain checked in with EMS, who indicated they knew nothing about family. Spiritual care services available as needed.   Jeri Lager, Chaplain
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
## 2019-10-13 NOTE — ED Notes (Signed)
Attempted to call daughters number that is listed in chart with no answer. Pt unaware of any numbers and states her phone is in her car
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
## 2019-10-13 NOTE — ED Notes (Signed)
Daughter updated on phone and states she will come pick her up

## 2019-10-14 ENCOUNTER — Encounter: Payer: Self-pay | Admitting: Emergency Medicine

## 2019-12-04 ENCOUNTER — Other Ambulatory Visit: Payer: Self-pay | Admitting: Family Medicine

## 2019-12-04 DIAGNOSIS — M79604 Pain in right leg: Secondary | ICD-10-CM

## 2019-12-06 ENCOUNTER — Other Ambulatory Visit: Payer: Self-pay

## 2019-12-06 ENCOUNTER — Ambulatory Visit
Admission: RE | Admit: 2019-12-06 | Discharge: 2019-12-06 | Disposition: A | Discharge status: Home / Self Care | Payer: BC Managed Care – PPO | Source: Ambulatory Visit | Attending: Family Medicine | Admitting: Family Medicine

## 2019-12-06 DIAGNOSIS — M79604 Pain in right leg: Secondary | ICD-10-CM | POA: Insufficient documentation

## 2020-02-27 ENCOUNTER — Other Ambulatory Visit: Payer: Self-pay | Admitting: Family Medicine

## 2020-02-27 DIAGNOSIS — Z1231 Encounter for screening mammogram for malignant neoplasm of breast: Secondary | ICD-10-CM

## 2020-05-19 ENCOUNTER — Ambulatory Visit: Payer: Self-pay | Admitting: Dermatology

## 2020-10-04 ENCOUNTER — Telehealth: Payer: Self-pay

## 2020-10-04 NOTE — Telephone Encounter (Signed)
Contacted patient for lung CT screening program based on referral from Dr. Letta Pate.  Patient states she had a chest CT on 07/27/2020 at [facility] for pulmonary nodules and they have recommended that she have another scan in 6 months.  I will inform Glenna Fellows, lung navigator of the above.  No CT scheduled for patient with lung CT screening clinic at this time.

## 2020-11-12 ENCOUNTER — Ambulatory Visit
Admission: RE | Admit: 2020-11-12 | Discharge: 2020-11-12 | Disposition: A | Discharge status: Home / Self Care | Payer: Self-pay | Source: Ambulatory Visit | Attending: Family Medicine | Admitting: Family Medicine

## 2020-11-12 ENCOUNTER — Other Ambulatory Visit: Payer: Self-pay | Admitting: Family Medicine

## 2020-11-12 DIAGNOSIS — J449 Chronic obstructive pulmonary disease, unspecified: Secondary | ICD-10-CM | POA: Insufficient documentation
